# Patient Record
Sex: Female | Born: 1965 | Race: White | Hispanic: No | Marital: Single | Smoking: Never smoker
Health system: Southern US, Community
[De-identification: ages and names within clinical notes are randomized; demographics above are authoritative.]

---

## 2000-10-02 ENCOUNTER — Other Ambulatory Visit: Admission: RE | Admit: 2000-10-02 | Discharge: 2000-10-02 | Payer: Self-pay | Admitting: Internal Medicine

## 2002-02-03 ENCOUNTER — Ambulatory Visit (HOSPITAL_COMMUNITY): Admission: RE | Admit: 2002-02-03 | Discharge: 2002-02-03 | Payer: Self-pay | Admitting: Family Medicine

## 2002-02-03 ENCOUNTER — Encounter: Payer: Self-pay | Admitting: Family Medicine

## 2003-09-01 ENCOUNTER — Other Ambulatory Visit: Admission: RE | Admit: 2003-09-01 | Discharge: 2003-09-01 | Payer: Self-pay | Admitting: Obstetrics and Gynecology

## 2011-02-04 ENCOUNTER — Ambulatory Visit (HOSPITAL_COMMUNITY)
Admission: RE | Admit: 2011-02-04 | Discharge: 2011-02-04 | Disposition: A | Discharge status: Home / Self Care | Payer: Managed Care, Other (non HMO) | Source: Ambulatory Visit | Attending: Obstetrics and Gynecology | Admitting: Obstetrics and Gynecology

## 2011-02-04 DIAGNOSIS — R011 Cardiac murmur, unspecified: Secondary | ICD-10-CM | POA: Insufficient documentation

## 2011-02-04 DIAGNOSIS — I059 Rheumatic mitral valve disease, unspecified: Secondary | ICD-10-CM | POA: Insufficient documentation

## 2011-02-04 DIAGNOSIS — E119 Type 2 diabetes mellitus without complications: Secondary | ICD-10-CM | POA: Insufficient documentation

## 2011-02-04 DIAGNOSIS — F17200 Nicotine dependence, unspecified, uncomplicated: Secondary | ICD-10-CM | POA: Insufficient documentation

## 2011-02-04 DIAGNOSIS — I10 Essential (primary) hypertension: Secondary | ICD-10-CM | POA: Insufficient documentation

## 2015-01-22 ENCOUNTER — Telehealth: Payer: Self-pay

## 2015-01-22 NOTE — Telephone Encounter (Signed)
Dr. Asa Lente requested to contact pt and inform that MD is willing to take her on as a new pt with the understanding that she has limited availability

## 2015-04-11 ENCOUNTER — Ambulatory Visit (INDEPENDENT_AMBULATORY_CARE_PROVIDER_SITE_OTHER): Payer: 59 | Admitting: Family Medicine

## 2015-04-11 ENCOUNTER — Encounter: Payer: Self-pay | Admitting: Family Medicine

## 2015-04-11 VITALS — BP 110/64 | HR 82 | Ht 64.5 in | Wt 137.8 lb

## 2015-04-11 DIAGNOSIS — G479 Sleep disorder, unspecified: Secondary | ICD-10-CM

## 2015-04-11 DIAGNOSIS — F341 Dysthymic disorder: Secondary | ICD-10-CM

## 2015-04-11 DIAGNOSIS — F901 Attention-deficit hyperactivity disorder, predominantly hyperactive type: Secondary | ICD-10-CM | POA: Diagnosis not present

## 2015-04-11 DIAGNOSIS — Z818 Family history of other mental and behavioral disorders: Secondary | ICD-10-CM | POA: Diagnosis not present

## 2015-04-11 MED ORDER — AMPHETAMINE-DEXTROAMPHETAMINE 10 MG PO TABS: 10.0000 mg | ORAL_TABLET | Freq: Every day | ORAL | Status: DC

## 2015-04-11 NOTE — Patient Instructions (Addendum)
Let me know if the medicine works, how long doesn't work, and if you have any trouble with it. Use ear plugs  Insomnia Insomnia is frequent trouble falling and/or staying asleep. Insomnia can be a long term problem or a short term problem. Both are common. Insomnia can be a short term problem when the wakefulness is related to a certain stress or worry. Long term insomnia is often related to ongoing stress during waking hours and/or poor sleeping habits. Overtime, sleep deprivation itself can make the problem worse. Every little thing feels more severe because you are overtired and your ability to cope is decreased. CAUSES   Stress, anxiety, and depression.  Poor sleeping habits.  Distractions such as TV in the bedroom.  Naps close to bedtime.  Engaging in emotionally charged conversations before bed.  Technical reading before sleep.  Alcohol and other sedatives. They may make the problem worse. They can hurt normal sleep patterns and normal dream activity.  Stimulants such as caffeine for several hours prior to bedtime.  Pain syndromes and shortness of breath can cause insomnia.  Exercise late at night.  Changing time zones may cause sleeping problems (jet lag). It is sometimes helpful to have someone observe your sleeping patterns. They should look for periods of not breathing during the night (sleep apnea). They should also look to see how long those periods last. If you live alone or observers are uncertain, you can also be observed at a sleep clinic where your sleep patterns will be professionally monitored. Sleep apnea requires a checkup and treatment. Give your caregivers your medical history. Give your caregivers observations your family has made about your sleep.  SYMPTOMS   Not feeling rested in the morning.  Anxiety and restlessness at bedtime.  Difficulty falling and staying asleep. TREATMENT   Your caregiver may prescribe treatment for an underlying medical  disorders. Your caregiver can give advice or help if you are using alcohol or other drugs for self-medication. Treatment of underlying problems will usually eliminate insomnia problems.  Medications can be prescribed for short time use. They are generally not recommended for lengthy use.  Over-the-counter sleep medicines are not recommended for lengthy use. They can be habit forming.  You can promote easier sleeping by making lifestyle changes such as:  Using relaxation techniques that help with breathing and reduce muscle tension.  Exercising earlier in the day.  Changing your diet and the time of your last meal. No night time snacks.  Establish a regular time to go to bed.  Counseling can help with stressful problems and worry.  Soothing music and white noise may be helpful if there are background noises you cannot remove.  Stop tedious detailed work at least one hour before bedtime. HOME CARE INSTRUCTIONS   Keep a diary. Inform your caregiver about your progress. This includes any medication side effects. See your caregiver regularly. Take note of:  Times when you are asleep.  Times when you are awake during the night.  The quality of your sleep.  How you feel the next day. This information will help your caregiver care for you.  Get out of bed if you are still awake after 15 minutes. Read or do some quiet activity. Keep the lights down. Wait until you feel sleepy and go back to bed.  Keep regular sleeping and waking hours. Avoid naps.  Exercise regularly.  Avoid distractions at bedtime. Distractions include watching television or engaging in any intense or detailed activity like attempting to balance  the household checkbook.  Develop a bedtime ritual. Keep a familiar routine of bathing, brushing your teeth, climbing into bed at the same time each night, listening to soothing music. Routines increase the success of falling to sleep faster.  Use relaxation techniques.  This can be using breathing and muscle tension release routines. It can also include visualizing peaceful scenes. You can also help control troubling or intruding thoughts by keeping your mind occupied with boring or repetitive thoughts like the old concept of counting sheep. You can make it more creative like imagining planting one beautiful flower after another in your backyard garden.  During your day, work to eliminate stress. When this is not possible use some of the previous suggestions to help reduce the anxiety that accompanies stressful situations. MAKE SURE YOU:   Understand these instructions.  Will watch your condition.  Will get help right away if you are not doing well or get worse. Document Released: 11/28/2000 Document Revised: 02/23/2012 Document Reviewed: 12/29/2007 [facility] Patient Information 2015 [place], [place]. This information is not intended to replace advice given to you by your health care provider. Make sure you discuss any questions you have with your health care provider.

## 2015-04-11 NOTE — Progress Notes (Signed)
   Subjective:    Patient ID: Maureen White, female    DOB: Feb 02, 1966, 49 y.o.   MRN: [id number]  HPI She is here to establish care. Her previously her care was through her gynecologist. He has been treating her for hypothyroid with Synthroid however she had difficulty with that medication and is now on an over-the-counter thyroid medication. She states that her last TSH was around 4 and prior to this it was around 5. Also she is taking Wellbutrin 300 mg per she has been on this for several years. There is a family history of depression. She states that the medicine helps stabilize her mood. She does occasionally use lorazepam usually for sleep. Her partner apparently cannot sleep without some form of white noise which usually takes the form of leaving the TV on. She and her partner have been involved in counseling. She is also been involved in individual counseling. She also has questions concerning memory as well as easy distractibility. Further discussion with her indicates she has had difficulty with focus, and tension and inability to get her tasks done. She states that this has occurred her entire life.   Review of Systems     Objective:   Physical Exam Alert and in no distress. ADHD score was 46.       Assessment & Plan:  ADHD (attention deficit hyperactivity disorder), predominantly hyperactive impulsive type - Plan: amphetamine-dextroamphetamine (ADDERALL) 10 MG tablet  Sleep disturbance  Family history of depression  Dysthymia At this time I will continue her on her present medication of Wellbutrin with occasional use of the lorazepam. Did mention using earplugs to help with sleep at night. Also gave sleep hygiene information. Encouraged her to continue in counseling. She certainly fulfills criteria of ADHD with her questionnaire being very positive and history of this occurring her entire life.I will start her out on Adderall. Discussed letting me know if it works, how long it  works and if she has any side effects. She will call me next week.

## 2015-04-18 ENCOUNTER — Telehealth: Payer: Self-pay | Admitting: Family Medicine

## 2015-04-18 NOTE — Telephone Encounter (Signed)
She actually told me that she had taken 20 without any benefit. I will have her go to 30 mg.

## 2015-04-18 NOTE — Telephone Encounter (Signed)
She called stating that the Adderall was not effective. I will have her go to 15 mg

## 2015-04-18 NOTE — Telephone Encounter (Signed)
Pt called and stated that she did not think the strength of adderall is not strong enough. She would like either a stronger dose or extended release. Pt can be reached at [phone].

## 2015-04-19 ENCOUNTER — Telehealth: Payer: Self-pay | Admitting: Family Medicine

## 2015-04-19 NOTE — Telephone Encounter (Signed)
Initiated P.A. Methylphenidate

## 2015-04-21 NOTE — Telephone Encounter (Signed)
P.A. Approved til 04/18/16, faxed pharmacy

## 2015-04-26 ENCOUNTER — Telehealth: Payer: Self-pay | Admitting: Family Medicine

## 2015-04-26 MED ORDER — METHYLPHENIDATE HCL 20 MG PO TABS: 20.0000 mg | ORAL_TABLET | Freq: Two times a day (BID) | ORAL | Status: DC

## 2015-04-26 NOTE — Telephone Encounter (Signed)
She states that 30 mg of the Adderall really had very little effect. I will therefore switch her to Ritalin starting at 20 mg. I wrote for twice a day dosing but recommended she try just 1 pill at time to see if it works, how long it works and if there are any difficulties

## 2015-04-26 NOTE — Telephone Encounter (Signed)
Pt called and stated she needed a refill for her ADD medications. She also mentioned that she would like to speak to you concerning the dose and/or medication change. She was offered an appt and she stated she was unable to come in right now. Please call pt at [phone].

## 2015-05-07 ENCOUNTER — Encounter: Payer: Self-pay | Admitting: Family Medicine

## 2015-05-29 ENCOUNTER — Ambulatory Visit (INDEPENDENT_AMBULATORY_CARE_PROVIDER_SITE_OTHER): Payer: 59 | Admitting: Family Medicine

## 2015-05-29 ENCOUNTER — Other Ambulatory Visit: Payer: Self-pay

## 2015-05-29 ENCOUNTER — Encounter: Payer: Self-pay | Admitting: Family Medicine

## 2015-05-29 VITALS — BP 112/70 | HR 60 | Wt 134.0 lb

## 2015-05-29 DIAGNOSIS — F341 Dysthymic disorder: Secondary | ICD-10-CM

## 2015-05-29 DIAGNOSIS — G479 Sleep disorder, unspecified: Secondary | ICD-10-CM | POA: Diagnosis not present

## 2015-05-29 DIAGNOSIS — Z630 Problems in relationship with spouse or partner: Secondary | ICD-10-CM | POA: Diagnosis not present

## 2015-05-29 DIAGNOSIS — M7918 Myalgia, other site: Secondary | ICD-10-CM

## 2015-05-29 DIAGNOSIS — F909 Attention-deficit hyperactivity disorder, unspecified type: Secondary | ICD-10-CM | POA: Insufficient documentation

## 2015-05-29 DIAGNOSIS — F901 Attention-deficit hyperactivity disorder, predominantly hyperactive type: Secondary | ICD-10-CM

## 2015-05-29 DIAGNOSIS — E039 Hypothyroidism, unspecified: Secondary | ICD-10-CM

## 2015-05-29 DIAGNOSIS — M791 Myalgia: Secondary | ICD-10-CM

## 2015-05-29 LAB — POCT URINALYSIS DIPSTICK
Bilirubin, UA: NEGATIVE
Blood, UA: NEGATIVE
Glucose, UA: NEGATIVE
KETONES UA: NEGATIVE
Leukocytes, UA: NEGATIVE
Nitrite, UA: NEGATIVE
PH UA: 6
PROTEIN UA: NEGATIVE
SPEC GRAV UA: 1.02
Urobilinogen, UA: NEGATIVE

## 2015-05-29 MED ORDER — ALPRAZOLAM 0.5 MG PO TABS: 0.5000 mg | ORAL_TABLET | Freq: Every evening | ORAL | Status: DC | PRN

## 2015-05-29 MED ORDER — BUPROPION HCL ER (XL) 300 MG PO TB24: 300.0000 mg | ORAL_TABLET | Freq: Every day | ORAL | Status: DC

## 2015-05-29 MED ORDER — LEVOTHYROXINE SODIUM 100 MCG PO TABS: 100.0000 ug | ORAL_TABLET | Freq: Every day | ORAL | Status: DC

## 2015-05-29 NOTE — Progress Notes (Signed)
   Subjective:    Patient ID: Maureen White, female    DOB: 30-Nov-1966, 49 y.o.   MRN: [id number]  HPI Approximately 4 days ago she noted the onset of left sided flank and back pain. No history of preceding illness injury to that area. Pain is gotten much worse and is made worse with motion. She has seen chiropractor in the past for other issues. She did try Flexeril which made her quite drowsy. She is now 5 weeks out of a long-term relationship and still having difficulty dealing with this. She has difficulty with anxiety and she continues in counseling with Rhona Raider working on personal issues. She needs a refill on her Wellbutrin. She has been on that for quite some time. She would also like some Xanax to help with sleep and anxiety. She does have an underlying history of ADD. She has been on Ritalin 20 mg and noted no real change in her ability to focus. She does state that that seems to be the main problem right now specialist about the stress that she is under. She also recently saw her gynecologist and apparently thyroid function was not normal. She has concerns over the thyroid medication that she is getting.   Review of Systems     Objective:   Physical Exam Alert and in no distress. Pain on motion with some tenderness palpation in the lower thoracic, upper lumbar area on the left. Lungs are clear to auscultation. Slight tenderness over the upper lumbar and lower thoracic spine area.       Assessment & Plan:  ADHD (attention deficit hyperactivity disorder), predominantly hyperactive impulsive type  Sleep disturbance  Relationship problem between partners - Plan: ALPRAZolam (XANAX) 0.5 MG tablet  Dysthymia - Plan: buPROPion (WELLBUTRIN XL) 300 MG 24 hr tablet  Hypothyroidism, unspecified hypothyroidism type - Plan: levothyroxine (SYNTHROID, LEVOTHROID) 100 MCG tablet  Musculoskeletal pain - Plan: POCT Urinalysis Dipstick She will follow-up with her chiropractor to see if this  will help with her musculoskeletal pain. I will renew her Wellbutrin. Xanax we called in to help with anxiety and sleep. Sample of Synthroid 100 g given. She will let me know how this makes her feel. I will also have her increase her Ritalin to 30 mg twice per day and see if this will help with focus. Discussed the possibility of switching her to [facility] but will hold off on that since she is already taking Wellbutrin.

## 2015-06-21 ENCOUNTER — Telehealth: Payer: Self-pay | Admitting: Family Medicine

## 2015-06-21 ENCOUNTER — Other Ambulatory Visit: Payer: Self-pay

## 2015-06-21 NOTE — Telephone Encounter (Signed)
Okay to renew

## 2015-06-21 NOTE — Telephone Encounter (Signed)
Recv'd fax refill request for Alprazolam 0.5mg  #30 to [facility]

## 2015-06-21 NOTE — Telephone Encounter (Signed)
Called in.

## 2015-06-28 ENCOUNTER — Telehealth: Payer: Self-pay | Admitting: Family Medicine

## 2015-06-28 DIAGNOSIS — F901 Attention-deficit hyperactivity disorder, predominantly hyperactive type: Secondary | ICD-10-CM

## 2015-06-28 NOTE — Telephone Encounter (Signed)
Needs Rx  For ADD meds  She wants to go back to Adderral

## 2015-07-02 ENCOUNTER — Telehealth: Payer: Self-pay | Admitting: Family Medicine

## 2015-07-02 MED ORDER — AMPHETAMINE-DEXTROAMPHETAMINE 15 MG PO TABS: 15.0000 mg | ORAL_TABLET | Freq: Every day | ORAL | Status: DC

## 2015-07-02 NOTE — Telephone Encounter (Signed)
Pt informed Rx x1 ready for pick up

## 2015-07-02 NOTE — Telephone Encounter (Signed)
Pt called again to request refill on Adderall. She said thinks the mg's were 30mg 

## 2015-07-02 NOTE — Telephone Encounter (Signed)
She would like to switch back to Adderall. She was on 20 of Ritalin with some benefit but not enough. I will order back on 15 of Adderall and she will let me know how she does on this.

## 2015-07-04 ENCOUNTER — Telehealth: Payer: Self-pay | Admitting: Family Medicine

## 2015-07-04 NOTE — Telephone Encounter (Signed)
Initiated P.A. Amphetamine Salts

## 2015-07-05 NOTE — Telephone Encounter (Signed)
P.A. Approved til 07/03/16, pt informed, faxed pharmacy

## 2015-07-25 ENCOUNTER — Other Ambulatory Visit: Payer: Self-pay

## 2015-07-25 ENCOUNTER — Other Ambulatory Visit: Payer: Self-pay | Admitting: Family Medicine

## 2015-07-25 NOTE — Telephone Encounter (Signed)
IS THIS OKAY 

## 2015-07-25 NOTE — Telephone Encounter (Signed)
Okay to renew

## 2015-08-01 ENCOUNTER — Other Ambulatory Visit: Payer: Self-pay | Admitting: Family Medicine

## 2015-08-01 DIAGNOSIS — B001 Herpesviral vesicular dermatitis: Secondary | ICD-10-CM

## 2015-08-01 MED ORDER — VALACYCLOVIR HCL 1 G PO TABS: ORAL_TABLET | ORAL | Status: AC

## 2015-08-01 MED ORDER — AMPHETAMINE-DEXTROAMPHETAMINE 20 MG PO TABS: 20.0000 mg | ORAL_TABLET | Freq: Every day | ORAL | Status: DC

## 2015-08-01 NOTE — Progress Notes (Signed)
She has a hx of Herpes Labialis and needs Valtrex. Also needs a higher dose of Adderall

## 2015-08-28 ENCOUNTER — Other Ambulatory Visit: Payer: Self-pay | Admitting: Family Medicine

## 2015-08-28 ENCOUNTER — Other Ambulatory Visit: Payer: Self-pay

## 2015-08-28 ENCOUNTER — Ambulatory Visit: Payer: 59 | Admitting: Family Medicine

## 2015-08-28 MED ORDER — SULFAMETHOXAZOLE-TRIMETHOPRIM 800-160 MG PO TABS: 1.0000 | ORAL_TABLET | Freq: Two times a day (BID) | ORAL | Status: DC

## 2015-08-28 NOTE — Telephone Encounter (Signed)
Okay to renew

## 2015-08-28 NOTE — Telephone Encounter (Signed)
Is this okay?

## 2015-10-18 ENCOUNTER — Other Ambulatory Visit: Payer: Self-pay | Admitting: Family Medicine

## 2015-10-18 NOTE — Telephone Encounter (Signed)
Is this ok to refill?  

## 2015-10-18 NOTE — Telephone Encounter (Signed)
Okay to renew

## 2015-10-19 NOTE — Telephone Encounter (Signed)
Called in med to pharmacy  

## 2015-11-22 ENCOUNTER — Other Ambulatory Visit: Payer: Self-pay | Admitting: Family Medicine

## 2015-11-22 NOTE — Telephone Encounter (Signed)
OK to fill

## 2015-11-22 NOTE — Telephone Encounter (Signed)
Called script to [facility].

## 2015-11-22 NOTE — Telephone Encounter (Signed)
Okay to renew

## 2015-12-27 ENCOUNTER — Other Ambulatory Visit: Payer: Self-pay | Admitting: Family Medicine

## 2015-12-27 NOTE — Telephone Encounter (Signed)
Called in med to pharmacy  

## 2015-12-27 NOTE — Telephone Encounter (Signed)
Ok to refill 

## 2015-12-27 NOTE — Telephone Encounter (Signed)
Okay to renew

## 2016-02-12 ENCOUNTER — Other Ambulatory Visit: Payer: Self-pay | Admitting: *Deleted

## 2016-02-12 ENCOUNTER — Other Ambulatory Visit: Payer: Self-pay | Admitting: Family Medicine

## 2016-02-12 NOTE — Telephone Encounter (Signed)
Dr Redmond School ok to refill

## 2016-02-12 NOTE — Telephone Encounter (Signed)
Have her set up an appointment 

## 2016-02-12 NOTE — Telephone Encounter (Signed)
E-scribed by Dr Redmond School 1 refill needs an appt

## 2016-02-12 NOTE — Telephone Encounter (Signed)
Left VM for call back Have her set up an appointment ( Wellbutrin refilled with0 refills)

## 2016-02-20 ENCOUNTER — Telehealth: Payer: Self-pay | Admitting: Family Medicine

## 2016-02-20 MED ORDER — AMPHETAMINE-DEXTROAMPHETAMINE 20 MG PO TABS: 20.0000 mg | ORAL_TABLET | Freq: Every day | ORAL | Status: DC

## 2016-02-20 NOTE — Telephone Encounter (Signed)
Called pt to advise that [facility] 20mg  #30 script is ready to be picked up.

## 2016-02-20 NOTE — Telephone Encounter (Signed)
PT called for refill of Adderall.  Pt ph [phone]

## 2016-04-02 ENCOUNTER — Telehealth: Payer: Self-pay | Admitting: *Deleted

## 2016-04-02 NOTE — Telephone Encounter (Signed)
Patient called and is requesting refill on adderall and lorazepam. Said she also spoke to you about increasing the dosage on the adderall. She was advised that you were out of the office today and would address tomorrow.

## 2016-04-02 NOTE — Telephone Encounter (Signed)
Have her come in for a visit so we can discuss this

## 2016-04-03 NOTE — Telephone Encounter (Signed)
Left message for pt to call me back 

## 2016-04-04 NOTE — Telephone Encounter (Signed)
Pt has appointment 

## 2016-04-09 ENCOUNTER — Encounter: Payer: Self-pay | Admitting: Family Medicine

## 2016-04-18 ENCOUNTER — Encounter: Payer: Self-pay | Admitting: Family Medicine

## 2016-04-18 ENCOUNTER — Ambulatory Visit (INDEPENDENT_AMBULATORY_CARE_PROVIDER_SITE_OTHER): Payer: Commercial Managed Care - HMO | Admitting: Family Medicine

## 2016-04-18 DIAGNOSIS — F901 Attention-deficit hyperactivity disorder, predominantly hyperactive type: Secondary | ICD-10-CM

## 2016-04-18 DIAGNOSIS — F341 Dysthymic disorder: Secondary | ICD-10-CM | POA: Diagnosis not present

## 2016-04-18 MED ORDER — BUPROPION HCL ER (XL) 300 MG PO TB24: ORAL_TABLET | ORAL | Status: DC

## 2016-04-18 MED ORDER — AMPHETAMINE-DEXTROAMPHET ER 25 MG PO CP24: 25.0000 mg | ORAL_CAPSULE | ORAL | Status: DC

## 2016-04-18 MED ORDER — CITALOPRAM HYDROBROMIDE 20 MG PO TABS: 20.0000 mg | ORAL_TABLET | Freq: Every day | ORAL | Status: DC

## 2016-04-18 NOTE — Progress Notes (Signed)
   Subjective:    Patient ID: Maureen White, female    DOB: 01-Apr-1966, 50 y.o.   MRN: [id number]  HPI She is here for a follow-up concerning her underlying ADD as well as dysthymia. She has been on Adderall 20 mg and getting roughly 6-7 hours of benefit out of it however she is concerned that she might need a higher dose for better focus. She is also noted some slight irritability when the medication wears off. She also has been on Wellbutrin for several years. She has underlying history of dysthymia and found that the Wellbutrin helped remove a cloud from her. Psychologically her personally seem to be in better shape and she feels as if she would like to see if she can feel even better. She has been getting lorazepam from her gynecologist and rarely uses that.   Review of Systems     Objective:   Physical Exam Alert and in no distress otherwise not examined. She is dressed appropriately with appropriate affect       Assessment & Plan:  ADHD (attention deficit hyperactivity disorder), predominantly hyperactive impulsive type - Plan: amphetamine-dextroamphetamine (ADDERALL XR) 25 MG 24 hr capsule  Dysthymia - Plan: citalopram (CELEXA) 20 MG tablet, buPROPion (WELLBUTRIN XL) 300 MG 24 hr tablet I will try her on Adderall XR to see if she can have less withdrawal symptoms and get better focus. She will let me how this works. Discussed the Wellbutrin with her and have decided to add Celexa to the regimen to see if this will help overall with her mental health. She will keep me informed concerning this. Over 25 minutes, greater than 50% spent in counseling and coordination of care.

## 2016-05-29 ENCOUNTER — Telehealth: Payer: Self-pay | Admitting: Internal Medicine

## 2016-05-29 DIAGNOSIS — F901 Attention-deficit hyperactivity disorder, predominantly hyperactive type: Secondary | ICD-10-CM

## 2016-05-29 MED ORDER — AMPHETAMINE-DEXTROAMPHET ER 25 MG PO CP24: 25.0000 mg | ORAL_CAPSULE | ORAL | Status: DC

## 2016-05-29 NOTE — Telephone Encounter (Signed)
Pt called needing a refill on her adderall. Pt was advised it would be tomorrow 05/30/16 before you returned.

## 2016-06-10 ENCOUNTER — Telehealth: Payer: Self-pay | Admitting: Family Medicine

## 2016-06-10 ENCOUNTER — Other Ambulatory Visit: Payer: Self-pay

## 2016-06-10 MED ORDER — LORAZEPAM 1 MG PO TABS: 1.0000 mg | ORAL_TABLET | Freq: Two times a day (BID) | ORAL | Status: DC | PRN

## 2016-06-10 MED ORDER — PAROXETINE HCL 10 MG PO TABS: 10.0000 mg | ORAL_TABLET | Freq: Every day | ORAL | Status: DC

## 2016-06-10 NOTE — Telephone Encounter (Signed)
Called in xanax per JCL 

## 2016-06-10 NOTE — Telephone Encounter (Signed)
Pt come in and wants a refill on her lorazepam  States that she was getting at her gynecologist  dr but would prefer to get all of her medicine from you. Pt uses [facility], [place]

## 2016-06-10 NOTE — Telephone Encounter (Signed)
Call in the lorazepam

## 2016-06-10 NOTE — Telephone Encounter (Signed)
He has been getting lorazepam from her gynecologist and would like to get it from me. She is using this once or twice per day to help with anxiety. She stopped taking the Celexa due to the fact that she thought it made her sick. I will give her lorazepam, switch her to Paxil and have her follow-up with me in one month.

## 2016-08-26 ENCOUNTER — Other Ambulatory Visit: Payer: Self-pay | Admitting: Family Medicine

## 2016-08-26 NOTE — Telephone Encounter (Signed)
Is this okay to refill? 

## 2016-08-28 ENCOUNTER — Other Ambulatory Visit: Payer: Self-pay | Admitting: Family Medicine

## 2016-08-28 NOTE — Telephone Encounter (Signed)
Is this okay to refill? 

## 2016-12-30 DIAGNOSIS — Z23 Encounter for immunization: Secondary | ICD-10-CM | POA: Diagnosis not present

## 2017-01-22 ENCOUNTER — Telehealth: Payer: Self-pay | Admitting: Family Medicine

## 2017-01-22 DIAGNOSIS — F901 Attention-deficit hyperactivity disorder, predominantly hyperactive type: Secondary | ICD-10-CM

## 2017-01-22 MED ORDER — AMPHETAMINE-DEXTROAMPHET ER 25 MG PO CP24: 25.0000 mg | ORAL_CAPSULE | ORAL | 0 refills | Status: DC

## 2017-01-22 NOTE — Telephone Encounter (Signed)
Pt called for refills of Adderall XR. Please call pt at [phone] when ready.

## 2017-01-22 NOTE — Telephone Encounter (Signed)
Left message rx is ready

## 2017-02-05 DIAGNOSIS — D492 Neoplasm of unspecified behavior of bone, soft tissue, and skin: Secondary | ICD-10-CM | POA: Diagnosis not present

## 2017-02-11 DIAGNOSIS — Z1231 Encounter for screening mammogram for malignant neoplasm of breast: Secondary | ICD-10-CM | POA: Diagnosis not present

## 2017-02-11 DIAGNOSIS — N912 Amenorrhea, unspecified: Secondary | ICD-10-CM | POA: Diagnosis not present

## 2017-02-11 DIAGNOSIS — Z6821 Body mass index (BMI) 21.0-21.9, adult: Secondary | ICD-10-CM | POA: Diagnosis not present

## 2017-02-11 DIAGNOSIS — Z01419 Encounter for gynecological examination (general) (routine) without abnormal findings: Secondary | ICD-10-CM | POA: Diagnosis not present

## 2017-06-23 ENCOUNTER — Telehealth: Payer: Self-pay | Admitting: Family Medicine

## 2017-06-23 NOTE — Telephone Encounter (Signed)
Called and got no answer.

## 2017-06-23 NOTE — Telephone Encounter (Signed)
Pt needs refill Adderall, please call when ready

## 2017-06-23 NOTE — Telephone Encounter (Signed)
She needs an office visit 

## 2017-06-30 ENCOUNTER — Ambulatory Visit (INDEPENDENT_AMBULATORY_CARE_PROVIDER_SITE_OTHER): Payer: 59 | Admitting: Family Medicine

## 2017-06-30 ENCOUNTER — Encounter: Payer: Self-pay | Admitting: Family Medicine

## 2017-06-30 VITALS — BP 106/68 | HR 89 | Wt 127.6 lb

## 2017-06-30 DIAGNOSIS — F901 Attention-deficit hyperactivity disorder, predominantly hyperactive type: Secondary | ICD-10-CM

## 2017-06-30 DIAGNOSIS — F341 Dysthymic disorder: Secondary | ICD-10-CM

## 2017-06-30 DIAGNOSIS — G479 Sleep disorder, unspecified: Secondary | ICD-10-CM | POA: Insufficient documentation

## 2017-06-30 MED ORDER — LISDEXAMFETAMINE DIMESYLATE 50 MG PO CAPS
50.0000 mg | ORAL_CAPSULE | Freq: Every day | ORAL | 0 refills | Status: DC
Start: 2017-06-30 — End: 2017-08-04

## 2017-06-30 NOTE — Progress Notes (Signed)
   Subjective:    Patient ID: Maureen White, female    DOB: 10-29-1966, 51 y.o.   MRN: [id number]  HPI She is here for a consult. She does have underlying ADD and has been using Adderall XR. She is not sure whether it is giving her good relief. She states it lasts roughly 5 hours. She is interested in a longer acting preparation. She also notes difficulty with sleep disturbance and has been using lorazepam to help with that. She is in the process of trying to wean herself off that. She is also taking Wellbutrin and has been on that for several years. She feels the need to continue on that in that she is dealing with the end of a long-term relationship. 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 She realizes codependency issues.   Review of Systems     Objective:   Physical Exam Alert and in no distress with appropriate affect       Assessment & Plan:  ADHD (attention deficit hyperactivity disorder), predominantly hyperactive impulsive type - Plan: lisdexamfetamine (VYVANSE) 50 MG capsule  Dysthymia  Sleep disturbance Sleep hygiene information given to help with getting off the lorazepam. She will continue on the Wellbutrin for the time being as this is helping psychologically. We may readdress this next year. The lorazepam and Wellbutrin were given to her by Dr. Radene Knee her gynecologist. I will try her on Vyvanse hoping she can get it at a reasonable price. Otherwise I will switch her back to Adderall and will work on the appropriate dosing regimen. May possibly need to go to twice a day dosing of the Adderall XR

## 2017-06-30 NOTE — Patient Instructions (Signed)

## 2017-08-04 ENCOUNTER — Telehealth: Payer: Self-pay | Admitting: Family Medicine

## 2017-08-04 MED ORDER — LISDEXAMFETAMINE DIMESYLATE 60 MG PO CAPS: 60.0000 mg | ORAL_CAPSULE | ORAL | 0 refills | Status: DC

## 2017-08-04 NOTE — Telephone Encounter (Signed)
Pt called and is requesting a refill on her vyvanse she is also wanting to know if she could get a higher dose she does not think the 50 is doing enough, she can be reached at [phone] when ready to be picked up

## 2017-08-04 NOTE — Telephone Encounter (Signed)
Pt informed Rx Vyvanse ready for pick up

## 2017-08-04 NOTE — Telephone Encounter (Signed)
She would like a higher dose of Vyvanse. She is not sure whether the lower doses really working as well as she would like

## 2017-09-23 ENCOUNTER — Telehealth: Payer: Self-pay | Admitting: Family Medicine

## 2017-09-23 NOTE — Telephone Encounter (Signed)
Pt needs refill Vyvanse 60mg , pt is out, please call when ready

## 2017-09-23 NOTE — Telephone Encounter (Signed)
It looks like dose was increased by Dr. Redmond School on last refill, based on a phone call.  I'm not sure if he would like feedback on that dose before refilling it for her.  She has no follow-up scheduled.  I'm forwarding to Dr. Redmond School.

## 2017-09-28 MED ORDER — LISDEXAMFETAMINE DIMESYLATE 70 MG PO CAPS: 70.0000 mg | ORAL_CAPSULE | Freq: Every day | ORAL | 0 refills | Status: DC

## 2017-09-28 NOTE — Telephone Encounter (Signed)
Call and verify that the Vyvanse 60 mg is working and I will write for that

## 2017-09-28 NOTE — Telephone Encounter (Signed)
Pt states she has not noticed a big difference since the increase. She inquires if medication can be increased again. Victorino December

## 2017-09-28 NOTE — Telephone Encounter (Signed)
Pt called to check on status.

## 2017-09-28 NOTE — Telephone Encounter (Signed)
Apparently the Vyvanse 60 has not worked. I will increase her to 70 and have her call back.

## 2017-09-29 ENCOUNTER — Telehealth: Payer: Self-pay | Admitting: Family Medicine

## 2017-09-29 NOTE — Telephone Encounter (Signed)
Left message informing pt rx is ready.

## 2017-09-30 ENCOUNTER — Ambulatory Visit (INDEPENDENT_AMBULATORY_CARE_PROVIDER_SITE_OTHER): Payer: 59 | Admitting: Family Medicine

## 2017-09-30 ENCOUNTER — Encounter: Payer: Self-pay | Admitting: Family Medicine

## 2017-09-30 VITALS — BP 122/72 | HR 70 | Temp 98.4°F | Wt 123.8 lb

## 2017-09-30 DIAGNOSIS — L509 Urticaria, unspecified: Secondary | ICD-10-CM

## 2017-09-30 DIAGNOSIS — Z23 Encounter for immunization: Secondary | ICD-10-CM

## 2017-09-30 DIAGNOSIS — F901 Attention-deficit hyperactivity disorder, predominantly hyperactive type: Secondary | ICD-10-CM

## 2017-09-30 DIAGNOSIS — H00011 Hordeolum externum right upper eyelid: Secondary | ICD-10-CM

## 2017-09-30 NOTE — Progress Notes (Signed)
   Subjective:    Patient ID: Maureen White, female    DOB: 1966-04-16, 51 y.o.   MRN: [id number]  HPI She is here for evaluation of a lesion on her right upper eyelid that has been there for about a week. She also recently started the higher dose of Vyvanse and thinks that it is helping but has only had one dose. She also is slightly went to the funeral of a relative who apparently died of an overdose. She also admits to very strong family history of drug abuse and suicide. This did bring back some unresolved issues since she developed hives after that which apparently this occurs when she gets stressed. She did note that Xanax did help quiet this down.   Review of Systems     Objective:   Physical Exam Alert and in no distress. 0.5 cm swollen slightly tender lesion noted on the midportion of the right upper eyelid. Cornea and conjunctiva normal.       Assessment & Plan:  Need for prophylactic vaccination and inoculation against influenza - Plan: Flu Vaccine QUAD 6+ mos PF IM (Fluarix Quad PF)  Hordeolum externum of right upper eyelid  ADHD (attention deficit hyperactivity disorder), predominantly hyperactive impulsive type  Hives Recommended warm soaks for the stye 3 times per day and if continued difficulty and we call for referral to ophthalmologist. She is to call me in the next week to let me know how the higher dose of the Vyvanse is working. We then discussed the stress that she is under from the recent death. She is getting counseling and is also starting to go to Al-Anon. She does realize that her family history is very significant to the way she is handling her present problem. Among other things she realizes that she is a adult child of an alcoholic and has always been a pleaser. She is now trying to work on the relationship she is having with herself. At this point we will not treat the hives that she is having less difficulty with that.

## 2017-11-13 ENCOUNTER — Telehealth: Payer: Self-pay | Admitting: Family Medicine

## 2017-11-13 DIAGNOSIS — Z0100 Encounter for examination of eyes and vision without abnormal findings: Secondary | ICD-10-CM | POA: Diagnosis not present

## 2017-11-13 NOTE — Telephone Encounter (Signed)
Pt called requesting  A refill on her vyvanse pt can be reached at [phone] informed pt that you was out of the office today,

## 2017-11-15 MED ORDER — LISDEXAMFETAMINE DIMESYLATE 70 MG PO CAPS: 70.0000 mg | ORAL_CAPSULE | Freq: Every day | ORAL | 0 refills | Status: DC

## 2017-11-16 MED ORDER — LISDEXAMFETAMINE DIMESYLATE 70 MG PO CAPS: 70.0000 mg | ORAL_CAPSULE | Freq: Every day | ORAL | 0 refills | Status: DC

## 2017-11-16 NOTE — Telephone Encounter (Signed)
Spoke with Abigail Butts at [facility]- They rcv'd the script on [place] for 11/15/17 and 12/16/17, and they were properly written. Please re-send script for 01/16/2018. They will place it on hold. Victorino December

## 2017-11-16 NOTE — Addendum Note (Signed)
Addended by: Jill Alexanders C on: 11/16/2017 11:00 AM   Modules accepted: Orders

## 2018-01-07 ENCOUNTER — Telehealth: Payer: Self-pay | Admitting: Family Medicine

## 2018-01-07 ENCOUNTER — Other Ambulatory Visit: Payer: Self-pay | Admitting: Medical

## 2018-01-07 MED ORDER — LISDEXAMFETAMINE DIMESYLATE 70 MG PO CAPS: 70.0000 mg | ORAL_CAPSULE | Freq: Every day | ORAL | 0 refills | Status: DC

## 2018-01-07 NOTE — Telephone Encounter (Signed)
PT informed rx is ready

## 2018-01-07 NOTE — Telephone Encounter (Signed)
rx ready, 13mo supply

## 2018-01-07 NOTE — Telephone Encounter (Signed)
Pt called for refills of Vyvanse. Please send to [facility]. Pt can be reached at [phone]. Sending to [facility] as Kendrick Fries is not in office.

## 2018-01-12 DIAGNOSIS — R1084 Generalized abdominal pain: Secondary | ICD-10-CM | POA: Diagnosis not present

## 2018-01-14 ENCOUNTER — Telehealth: Payer: Self-pay | Admitting: Family Medicine

## 2018-01-14 NOTE — Telephone Encounter (Signed)
The record indicates that she should have a prescription dated 2/2 that she can pick up and should not need written prescription.

## 2018-01-14 NOTE — Telephone Encounter (Signed)
Please see initial message.

## 2018-01-14 NOTE — Telephone Encounter (Signed)
Pt called and stated that she called for refills of Vyvanse and a rx was hand written. RX is up front. She is requesting that this be sent electronically instead as she is having a hard time getting here to pick up. Pt uses [facility] and can be reached at [phone]. Sending back paper rx to be reviewed.

## 2018-01-19 DIAGNOSIS — R102 Pelvic and perineal pain: Secondary | ICD-10-CM | POA: Diagnosis not present

## 2018-01-19 DIAGNOSIS — R1031 Right lower quadrant pain: Secondary | ICD-10-CM | POA: Diagnosis not present

## 2018-04-18 ENCOUNTER — Other Ambulatory Visit: Payer: Self-pay | Admitting: Family Medicine

## 2018-04-19 ENCOUNTER — Telehealth: Payer: Self-pay | Admitting: Medical

## 2018-04-19 MED ORDER — LISDEXAMFETAMINE DIMESYLATE 70 MG PO CAPS: 70.0000 mg | ORAL_CAPSULE | Freq: Every day | ORAL | 0 refills | Status: DC

## 2018-04-19 NOTE — Telephone Encounter (Signed)
[facility] wants to fill pt vyvanse. Please advise. [place]

## 2018-04-19 NOTE — Telephone Encounter (Signed)
[facility] called to advise Vyvanse requiring P.A.

## 2018-04-27 NOTE — Telephone Encounter (Signed)
P.A. VYVANSE  °

## 2018-05-02 NOTE — Telephone Encounter (Signed)
P.A. Approved til 04/28/19, faxed pharmacy, tried to reach pt no answer

## 2018-06-04 ENCOUNTER — Telehealth: Payer: Self-pay

## 2018-06-04 MED ORDER — LISDEXAMFETAMINE DIMESYLATE 70 MG PO CAPS: 70.0000 mg | ORAL_CAPSULE | Freq: Every day | ORAL | 0 refills | Status: DC

## 2018-06-04 NOTE — Telephone Encounter (Signed)
Called patient to get her in for CPE, she has appointment on 08-20-18 to come in.   She also asked if you could refill her vyvance until her appointment.   She is now using [facility], [place],  Please advise.  Thank you

## 2018-06-04 NOTE — Addendum Note (Signed)
Addended by: Denita Lung on: 06/04/2018 12:39 PM   Modules accepted: Orders

## 2018-06-23 DIAGNOSIS — M79671 Pain in right foot: Secondary | ICD-10-CM | POA: Diagnosis not present

## 2018-06-23 DIAGNOSIS — M19071 Primary osteoarthritis, right ankle and foot: Secondary | ICD-10-CM | POA: Diagnosis not present

## 2018-08-04 ENCOUNTER — Other Ambulatory Visit: Payer: Self-pay

## 2018-08-04 MED ORDER — LISDEXAMFETAMINE DIMESYLATE 70 MG PO CAPS: 70.0000 mg | ORAL_CAPSULE | Freq: Every day | ORAL | 0 refills | Status: DC

## 2018-08-04 NOTE — Telephone Encounter (Signed)
Pt called and is requesting a refill on Vyvanse. Stated she will be in town next week and can be seen then. Stated she will call back and schedule an appt for then. Please advise.

## 2018-08-20 ENCOUNTER — Encounter: Payer: Self-pay | Admitting: Family Medicine

## 2018-09-17 ENCOUNTER — Encounter: Payer: Self-pay | Admitting: Family Medicine

## 2018-11-01 ENCOUNTER — Encounter: Payer: Self-pay | Admitting: Family Medicine

## 2018-11-01 ENCOUNTER — Ambulatory Visit: Payer: 59 | Admitting: Family Medicine

## 2018-11-01 VITALS — BP 120/78 | HR 68 | Temp 98.0°F | Wt 126.4 lb

## 2018-11-01 DIAGNOSIS — Z6821 Body mass index (BMI) 21.0-21.9, adult: Secondary | ICD-10-CM | POA: Diagnosis not present

## 2018-11-01 DIAGNOSIS — F901 Attention-deficit hyperactivity disorder, predominantly hyperactive type: Secondary | ICD-10-CM

## 2018-11-01 DIAGNOSIS — Z1231 Encounter for screening mammogram for malignant neoplasm of breast: Secondary | ICD-10-CM | POA: Diagnosis not present

## 2018-11-01 DIAGNOSIS — Z01419 Encounter for gynecological examination (general) (routine) without abnormal findings: Secondary | ICD-10-CM | POA: Diagnosis not present

## 2018-11-01 MED ORDER — AMPHETAMINE-DEXTROAMPHET ER 30 MG PO CP24: 30.0000 mg | ORAL_CAPSULE | Freq: Every day | ORAL | 0 refills | Status: DC

## 2018-11-01 NOTE — Progress Notes (Signed)
   Subjective:    Patient ID: Maureen White, female    DOB: 10/01/1966, 52 y.o.   MRN: [id number]  HPI She is here for recheck on ADHD.  She is on Vyvanse but states that she only gets roughly 4 hours with the benefit out of it.  She is on 70 mg pill.   Review of Systems     Objective:   Physical Exam Alert and in no distress otherwise not examined       Assessment & Plan:  ADHD (attention deficit hyperactivity disorder), predominantly hyperactive impulsive type - Plan: amphetamine-dextroamphetamine (ADDERALL XR) 30 MG 24 hr capsule I will switch her to Adderall XR 30.  She is to let me know if that works, how long it works and if she has any effects from that.  She is now living in [place].  Did suggest to get a physician in that area.

## 2018-11-02 ENCOUNTER — Encounter: Payer: Self-pay | Admitting: Family Medicine

## 2018-11-03 ENCOUNTER — Other Ambulatory Visit: Payer: Self-pay | Admitting: Obstetrics and Gynecology

## 2018-11-03 DIAGNOSIS — R928 Other abnormal and inconclusive findings on diagnostic imaging of breast: Secondary | ICD-10-CM

## 2018-11-10 ENCOUNTER — Other Ambulatory Visit: Payer: Managed Care, Other (non HMO)

## 2018-11-18 ENCOUNTER — Other Ambulatory Visit: Payer: Managed Care, Other (non HMO)

## 2018-11-19 ENCOUNTER — Ambulatory Visit: Payer: Managed Care, Other (non HMO)

## 2018-11-19 ENCOUNTER — Ambulatory Visit
Admission: RE | Admit: 2018-11-19 | Discharge: 2018-11-19 | Disposition: A | Discharge status: Home / Self Care | Payer: 59 | Source: Ambulatory Visit | Attending: Obstetrics and Gynecology | Admitting: Obstetrics and Gynecology

## 2018-11-19 DIAGNOSIS — R922 Inconclusive mammogram: Secondary | ICD-10-CM | POA: Diagnosis not present

## 2018-11-19 DIAGNOSIS — R928 Other abnormal and inconclusive findings on diagnostic imaging of breast: Secondary | ICD-10-CM

## 2019-02-08 ENCOUNTER — Other Ambulatory Visit: Payer: Self-pay

## 2019-02-08 DIAGNOSIS — F901 Attention-deficit hyperactivity disorder, predominantly hyperactive type: Secondary | ICD-10-CM

## 2019-02-08 MED ORDER — AMPHETAMINE-DEXTROAMPHET ER 30 MG PO CP24: 30.0000 mg | ORAL_CAPSULE | ORAL | 0 refills | Status: DC

## 2019-02-08 MED ORDER — AMPHETAMINE-DEXTROAMPHET ER 30 MG PO CP24: 30.0000 mg | ORAL_CAPSULE | Freq: Every day | ORAL | 0 refills | Status: DC

## 2019-02-08 NOTE — Telephone Encounter (Signed)
Patient called to request a refill on the pending medication  

## 2019-03-04 DIAGNOSIS — F411 Generalized anxiety disorder: Secondary | ICD-10-CM | POA: Diagnosis not present

## 2019-03-11 DIAGNOSIS — F411 Generalized anxiety disorder: Secondary | ICD-10-CM | POA: Diagnosis not present

## 2019-03-17 DIAGNOSIS — F411 Generalized anxiety disorder: Secondary | ICD-10-CM | POA: Diagnosis not present

## 2019-03-31 DIAGNOSIS — F411 Generalized anxiety disorder: Secondary | ICD-10-CM | POA: Diagnosis not present

## 2019-04-14 DIAGNOSIS — F411 Generalized anxiety disorder: Secondary | ICD-10-CM | POA: Diagnosis not present

## 2019-05-05 DIAGNOSIS — F411 Generalized anxiety disorder: Secondary | ICD-10-CM | POA: Diagnosis not present

## 2019-05-12 DIAGNOSIS — F411 Generalized anxiety disorder: Secondary | ICD-10-CM | POA: Diagnosis not present

## 2019-05-18 DIAGNOSIS — F411 Generalized anxiety disorder: Secondary | ICD-10-CM | POA: Diagnosis not present

## 2019-05-27 ENCOUNTER — Telehealth: Payer: Self-pay | Admitting: Family Medicine

## 2019-05-27 DIAGNOSIS — F901 Attention-deficit hyperactivity disorder, predominantly hyperactive type: Secondary | ICD-10-CM

## 2019-05-27 DIAGNOSIS — F411 Generalized anxiety disorder: Secondary | ICD-10-CM | POA: Diagnosis not present

## 2019-05-27 MED ORDER — AMPHETAMINE-DEXTROAMPHET ER 30 MG PO CP24: 30.0000 mg | ORAL_CAPSULE | Freq: Every day | ORAL | 0 refills | Status: DC

## 2019-05-27 MED ORDER — AMPHETAMINE-DEXTROAMPHET ER 30 MG PO CP24: 30.0000 mg | ORAL_CAPSULE | ORAL | 0 refills | Status: DC

## 2019-05-27 NOTE — Telephone Encounter (Signed)
Pt called and is requesting  A refill on her adderall pt needs it to go the [id number]

## 2019-06-30 DIAGNOSIS — F411 Generalized anxiety disorder: Secondary | ICD-10-CM | POA: Diagnosis not present

## 2019-07-01 ENCOUNTER — Telehealth: Payer: Self-pay | Admitting: Family Medicine

## 2019-07-01 NOTE — Telephone Encounter (Signed)
It is already at the store

## 2019-07-01 NOTE — Telephone Encounter (Signed)
Pt called and is requesting a refill on her adderall pt needs it to go to the [id number], [place]

## 2019-07-01 NOTE — Telephone Encounter (Signed)
Pt informed

## 2019-07-29 DIAGNOSIS — F411 Generalized anxiety disorder: Secondary | ICD-10-CM | POA: Diagnosis not present

## 2019-08-12 DIAGNOSIS — F411 Generalized anxiety disorder: Secondary | ICD-10-CM | POA: Diagnosis not present

## 2019-08-24 ENCOUNTER — Other Ambulatory Visit: Payer: Self-pay

## 2019-08-24 DIAGNOSIS — Z20822 Contact with and (suspected) exposure to covid-19: Secondary | ICD-10-CM

## 2019-08-26 LAB — NOVEL CORONAVIRUS, NAA: SARS-CoV-2, NAA: NOT DETECTED

## 2019-09-08 DIAGNOSIS — F411 Generalized anxiety disorder: Secondary | ICD-10-CM | POA: Diagnosis not present

## 2019-09-16 ENCOUNTER — Telehealth: Payer: Self-pay | Admitting: Family Medicine

## 2019-09-16 DIAGNOSIS — F901 Attention-deficit hyperactivity disorder, predominantly hyperactive type: Secondary | ICD-10-CM

## 2019-09-16 DIAGNOSIS — F411 Generalized anxiety disorder: Secondary | ICD-10-CM | POA: Diagnosis not present

## 2019-09-16 MED ORDER — AMPHETAMINE-DEXTROAMPHET ER 30 MG PO CP24: 30.0000 mg | ORAL_CAPSULE | ORAL | 0 refills | Status: DC

## 2019-09-16 MED ORDER — AMPHETAMINE-DEXTROAMPHET ER 30 MG PO CP24: 30.0000 mg | ORAL_CAPSULE | Freq: Every day | ORAL | 0 refills | Status: DC

## 2019-09-16 NOTE — Telephone Encounter (Signed)
PT called and needs refill on Adderall sent to the [facility] on [address].

## 2019-10-05 ENCOUNTER — Encounter: Payer: Self-pay | Admitting: Family Medicine

## 2019-10-05 ENCOUNTER — Other Ambulatory Visit: Payer: Self-pay

## 2019-10-05 ENCOUNTER — Ambulatory Visit: Payer: BC Managed Care – PPO | Admitting: Family Medicine

## 2019-10-05 VITALS — BP 110/68 | HR 80 | Temp 98.4°F | Wt 129.8 lb

## 2019-10-05 DIAGNOSIS — F901 Attention-deficit hyperactivity disorder, predominantly hyperactive type: Secondary | ICD-10-CM

## 2019-10-05 MED ORDER — DEXMETHYLPHENIDATE HCL ER 10 MG PO CP24: 10.0000 mg | ORAL_CAPSULE | Freq: Every day | ORAL | 0 refills | Status: DC
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
## 2019-10-06 DIAGNOSIS — F411 Generalized anxiety disorder: Secondary | ICD-10-CM | POA: Diagnosis not present

## 2019-10-20 DIAGNOSIS — F411 Generalized anxiety disorder: Secondary | ICD-10-CM | POA: Diagnosis not present

## 2019-11-17 DIAGNOSIS — F411 Generalized anxiety disorder: Secondary | ICD-10-CM | POA: Diagnosis not present

## 2019-11-24 ENCOUNTER — Other Ambulatory Visit: Payer: Self-pay

## 2019-11-24 DIAGNOSIS — Z20822 Contact with and (suspected) exposure to covid-19: Secondary | ICD-10-CM

## 2019-11-27 LAB — NOVEL CORONAVIRUS, NAA: SARS-CoV-2, NAA: NOT DETECTED

## 2019-12-22 ENCOUNTER — Telehealth: Payer: Self-pay | Admitting: Family Medicine

## 2019-12-22 MED ORDER — DEXMETHYLPHENIDATE HCL ER 15 MG PO CP24: 15.0000 mg | ORAL_CAPSULE | Freq: Every day | ORAL | 0 refills | Status: DC

## 2019-12-22 NOTE — Telephone Encounter (Signed)
Pt called for refills of focalin XR. She states that dose may need to be slightly increased. Pt uses [facility] on [address]. Pt pt can be reached at [phone].

## 2020-01-06 ENCOUNTER — Encounter: Payer: Self-pay | Admitting: Family Medicine

## 2020-01-06 DIAGNOSIS — F411 Generalized anxiety disorder: Secondary | ICD-10-CM | POA: Diagnosis not present

## 2020-01-09 ENCOUNTER — Telehealth: Payer: Self-pay

## 2020-01-09 DIAGNOSIS — F411 Generalized anxiety disorder: Secondary | ICD-10-CM | POA: Diagnosis not present

## 2020-01-09 NOTE — Telephone Encounter (Signed)
Called pt to find out to more info concerning the letter . No answer and drafted letter with office note . Will wait to hear from pt and letter will be emailed then. [place]

## 2020-01-09 NOTE — Telephone Encounter (Signed)
Error

## 2020-01-16 ENCOUNTER — Telehealth: Payer: Self-pay
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
## 2020-01-25 ENCOUNTER — Telehealth: Payer: Self-pay | Admitting: Family Medicine

## 2020-01-25 DIAGNOSIS — F901 Attention-deficit hyperactivity disorder, predominantly hyperactive type: Secondary | ICD-10-CM

## 2020-01-25 MED ORDER — AMPHETAMINE-DEXTROAMPHET ER 30 MG PO CP24: 30.0000 mg | ORAL_CAPSULE | ORAL | 0 refills | Status: DC

## 2020-01-25 MED ORDER — AMPHETAMINE-DEXTROAMPHET ER 30 MG PO CP24: 30.0000 mg | ORAL_CAPSULE | Freq: Every day | ORAL | 0 refills | Status: DC

## 2020-01-25 NOTE — Telephone Encounter (Signed)
Pt called and needs refill on Focalin sent to the [facility] on [address]

## 2020-01-25 NOTE — Telephone Encounter (Signed)
She states that the Adderall seems to work the best and I will therefore call that medication and not the Focalin

## 2020-02-10 DIAGNOSIS — F411 Generalized anxiety disorder: Secondary | ICD-10-CM | POA: Diagnosis not present

## 2020-03-06 DIAGNOSIS — Z79899 Other long term (current) drug therapy: Secondary | ICD-10-CM | POA: Diagnosis not present

## 2020-03-06 DIAGNOSIS — F419 Anxiety disorder, unspecified: Secondary | ICD-10-CM | POA: Diagnosis not present

## 2020-03-06 DIAGNOSIS — R41840 Attention and concentration deficit: Secondary | ICD-10-CM | POA: Diagnosis not present

## 2020-03-06 DIAGNOSIS — F902 Attention-deficit hyperactivity disorder, combined type: Secondary | ICD-10-CM | POA: Diagnosis not present

## 2020-03-22 DIAGNOSIS — F411 Generalized anxiety disorder: Secondary | ICD-10-CM | POA: Diagnosis not present

## 2020-04-11 DIAGNOSIS — F902 Attention-deficit hyperactivity disorder, combined type: Secondary | ICD-10-CM | POA: Diagnosis not present

## 2020-04-11 DIAGNOSIS — R41840 Attention and concentration deficit: Secondary | ICD-10-CM | POA: Diagnosis not present

## 2020-04-11 DIAGNOSIS — F419 Anxiety disorder, unspecified: Secondary | ICD-10-CM | POA: Diagnosis not present

## 2020-04-11 DIAGNOSIS — Z79899 Other long term (current) drug therapy: Secondary | ICD-10-CM | POA: Diagnosis not present

## 2020-05-04 DIAGNOSIS — M9903 Segmental and somatic dysfunction of lumbar region: Secondary | ICD-10-CM | POA: Diagnosis not present

## 2020-05-04 DIAGNOSIS — M19072 Primary osteoarthritis, left ankle and foot: Secondary | ICD-10-CM | POA: Diagnosis not present

## 2020-05-04 DIAGNOSIS — M19071 Primary osteoarthritis, right ankle and foot: Secondary | ICD-10-CM | POA: Diagnosis not present

## 2020-05-04 DIAGNOSIS — M9902 Segmental and somatic dysfunction of thoracic region: Secondary | ICD-10-CM | POA: Diagnosis not present

## 2020-05-04 DIAGNOSIS — M9905 Segmental and somatic dysfunction of pelvic region: Secondary | ICD-10-CM | POA: Diagnosis not present

## 2020-05-04 DIAGNOSIS — M62830 Muscle spasm of back: Secondary | ICD-10-CM | POA: Diagnosis not present

## 2020-06-14 DIAGNOSIS — F411 Generalized anxiety disorder: Secondary | ICD-10-CM | POA: Diagnosis not present

## 2020-07-30 DIAGNOSIS — M25572 Pain in left ankle and joints of left foot: Secondary | ICD-10-CM | POA: Diagnosis not present

## 2020-08-02 ENCOUNTER — Telehealth: Payer: Self-pay | Admitting: Family Medicine

## 2020-08-02 DIAGNOSIS — F411 Generalized anxiety disorder: Secondary | ICD-10-CM | POA: Diagnosis not present

## 2020-08-02 DIAGNOSIS — F901 Attention-deficit hyperactivity disorder, predominantly hyperactive type: Secondary | ICD-10-CM

## 2020-08-02 MED ORDER — AMPHETAMINE-DEXTROAMPHET ER 30 MG PO CP24: 30.0000 mg | ORAL_CAPSULE | Freq: Every day | ORAL | 0 refills | Status: DC

## 2020-08-02 MED ORDER — AMPHETAMINE-DEXTROAMPHET ER 30 MG PO CP24: 30.0000 mg | ORAL_CAPSULE | ORAL | 0 refills | Status: DC

## 2020-08-02 NOTE — Telephone Encounter (Signed)
Pt called for refills on Adderall XR. Please send to [facility] [address]. Pt can be reached at [phone]

## 2020-08-21 DIAGNOSIS — Z6823 Body mass index (BMI) 23.0-23.9, adult: Secondary | ICD-10-CM | POA: Diagnosis not present

## 2020-08-21 DIAGNOSIS — Z1321 Encounter for screening for nutritional disorder: Secondary | ICD-10-CM | POA: Diagnosis not present

## 2020-08-21 DIAGNOSIS — F419 Anxiety disorder, unspecified: Secondary | ICD-10-CM | POA: Diagnosis not present

## 2020-08-21 DIAGNOSIS — Z13220 Encounter for screening for lipoid disorders: Secondary | ICD-10-CM | POA: Diagnosis not present

## 2020-08-21 DIAGNOSIS — Z1329 Encounter for screening for other suspected endocrine disorder: Secondary | ICD-10-CM | POA: Diagnosis not present

## 2020-08-21 DIAGNOSIS — Z01419 Encounter for gynecological examination (general) (routine) without abnormal findings: Secondary | ICD-10-CM | POA: Diagnosis not present

## 2020-08-21 DIAGNOSIS — Z1231 Encounter for screening mammogram for malignant neoplasm of breast: Secondary | ICD-10-CM | POA: Diagnosis not present

## 2020-08-21 DIAGNOSIS — Z13228 Encounter for screening for other metabolic disorders: Secondary | ICD-10-CM | POA: Diagnosis not present

## 2020-08-21 LAB — HM PAP SMEAR: HM Pap smear: NEGATIVE

## 2020-08-21 LAB — HM MAMMOGRAPHY

## 2020-09-05 ENCOUNTER — Encounter: Payer: Self-pay | Admitting: Family Medicine

## 2020-09-06 DIAGNOSIS — F411 Generalized anxiety disorder: Secondary | ICD-10-CM | POA: Diagnosis not present

## 2020-09-11 ENCOUNTER — Encounter: Payer: Self-pay | Admitting: Family Medicine

## 2020-09-14 ENCOUNTER — Telehealth: Payer: Self-pay | Admitting: Family Medicine

## 2020-09-14 DIAGNOSIS — F411 Generalized anxiety disorder: Secondary | ICD-10-CM | POA: Diagnosis not present

## 2020-09-14 MED ORDER — AMPHETAMINE-DEXTROAMPHET ER 30 MG PO CP24
30.0000 mg | ORAL_CAPSULE | Freq: Every day | ORAL | 0 refills | Status: DC
Start: 2020-10-02 — End: 2020-12-04

## 2020-09-14 NOTE — Telephone Encounter (Signed)
Pt needs refill on Adderall sent [facility]

## 2020-10-31 DIAGNOSIS — F419 Anxiety disorder, unspecified: Secondary | ICD-10-CM | POA: Diagnosis not present

## 2020-10-31 DIAGNOSIS — F902 Attention-deficit hyperactivity disorder, combined type: Secondary | ICD-10-CM | POA: Diagnosis not present

## 2020-10-31 DIAGNOSIS — Z79899 Other long term (current) drug therapy: Secondary | ICD-10-CM | POA: Diagnosis not present

## 2020-12-04 ENCOUNTER — Other Ambulatory Visit: Payer: Self-pay

## 2020-12-04 ENCOUNTER — Encounter: Payer: Self-pay | Admitting: Adult Health

## 2020-12-04 ENCOUNTER — Ambulatory Visit (INDEPENDENT_AMBULATORY_CARE_PROVIDER_SITE_OTHER): Payer: 59 | Admitting: Adult Health

## 2020-12-04 VITALS — BP 136/83 | HR 72 | Ht 64.0 in | Wt 134.0 lb

## 2020-12-04 DIAGNOSIS — F411 Generalized anxiety disorder: Secondary | ICD-10-CM

## 2020-12-04 DIAGNOSIS — F1020 Alcohol dependence, uncomplicated: Secondary | ICD-10-CM

## 2020-12-04 MED ORDER — VENLAFAXINE HCL ER 75 MG PO CP24: 75.0000 mg | ORAL_CAPSULE | Freq: Every day | ORAL | 2 refills | Status: DC

## 2020-12-04 MED ORDER — VENLAFAXINE HCL ER 37.5 MG PO CP24: 37.5000 mg | ORAL_CAPSULE | Freq: Every day | ORAL | 2 refills | Status: DC

## 2020-12-04 MED ORDER — NALTREXONE HCL 50 MG PO TABS: 50.0000 mg | ORAL_TABLET | Freq: Every day | ORAL | 0 refills | Status: DC

## 2020-12-04 NOTE — Progress Notes (Signed)
[facility] MD/PA/NP Initial Note  12/04/2020 11:16 AM Maureen White  MRN:  [id number]  Chief Complaint:   HPI:   Describes mood today as "ok". Pleasant. Denies tearfulness. Mood symptoms - depressed some days - gets "triggered". Feels anxious "most of the time". Denies irritability. Has been working on decreased alcohol use. Partner feels like she has been drinking too much. Started drinking more a few months ago. Reports drinking after work and on the weekends. Stating "I was drinking to calm the anxiety". Has reduced use to drinking one night a week and on Friday and Saturday nights. Does not feel like current medication is helping to manage the anxiety. Is willing to start a taper off the Effexor and try a different medication. Stable interest and motivation. Taking medications as prescribed. Energy levels stable. Active, has a regular exercise routine. Working with a Clinical research associate.  Enjoys some usual interests and activities. Single. Lives alone with dog. Dating. Brother in [place] and [place]. One brother deceased. Parents deceased. Spending time with family. Appetite adequate. Weight loss - 3 pounds - 138 pounds.  Sleeps well most nights. Averages 7 to 8  hours with 2mg  of Ativan. Focus and concentration better with Vyvanse - "not great". Completing tasks. Managing aspects of household. Work full time - Personal assistant. Denies SI or HI.  Denies AH or VH.  Previous medication trials: Wellbutrin, Adderall  Visit Diagnosis:    ICD-10-CM   1. Generalized anxiety disorder  F41.1 venlafaxine XR (EFFEXOR XR) 37.5 MG 24 hr capsule    venlafaxine XR (EFFEXOR XR) 75 MG 24 hr capsule  2. Uncomplicated alcohol dependence ([place])  F10.20     Past Psychiatric History: Denies psychiatric hospitalization.  Past Medical History: No past medical history on file. No past surgical history on file.  Family Psychiatric History: Family members with mental health issues.  Family History:  Family History   Problem Relation Age of Onset   Breast cancer Maternal Aunt        late 1's    Social History:  Social History   Socioeconomic History   Marital status: Single    Spouse name: Not on file   Number of children: Not on file   Years of education: Not on file   Highest education level: Not on file  Occupational History   Not on file  Tobacco Use   Smoking status: Never Smoker   Smokeless tobacco: Never Used  Substance and Sexual Activity   Alcohol use: Yes    Alcohol/week: 0.0 standard drinks    Comment: weekends usually   Drug use: No   Sexual activity: Not on file  Other Topics Concern   Not on file  Social History Narrative   Not on file   Social Determinants of Health   Financial Resource Strain: Not on file  Food Insecurity: Not on file  Transportation Needs: Not on file  Physical Activity: Not on file  Stress: Not on file  Social Connections: Not on file    Allergies: No Known Allergies  Metabolic Disorder Labs: No results found for: HGBA1C, MPG No results found for: PROLACTIN No results found for: CHOL, TRIG, HDL, CHOLHDL, VLDL, LDLCALC No results found for: TSH  Therapeutic Level Labs: No results found for: LITHIUM No results found for: VALPROATE No components found for:  CBMZ  Current Medications: Current Outpatient Medications  Medication Sig Dispense Refill   guanFACINE (INTUNIV) 1 MG TB24 ER tablet Take 1 mg by mouth daily.  lisdexamfetamine (VYVANSE) 70 MG capsule Take 1 capsule (70 mg total) by mouth daily. (Patient not taking: Reported on 10/05/2019) 30 capsule 0   LORazepam (ATIVAN) 1 MG tablet Take 1 tablet (1 mg total) by mouth 2 (two) times daily as needed for anxiety. 50 tablet 0   LORazepam (ATIVAN) 2 MG tablet Take 2 mg by mouth 2 (two) times daily as needed.     naltrexone (DEPADE) 50 MG tablet Take 1 tablet (50 mg total) by mouth daily. 30 tablet 0   neomycin-polymyxin b-dexamethasone (MAXITROL) 3.5-10000-0.1  SUSP SMARTSIG:In Eye(s)     triamcinolone (KENALOG) 0.1 % SMARTSIG:1 Application Topical 2-3 Times Daily     valACYclovir (VALTREX) 1000 MG tablet Take 2 pills twice a day for one day at the onset of a blister 12 tablet 1   venlafaxine XR (EFFEXOR XR) 37.5 MG 24 hr capsule Take 1 capsule (37.5 mg total) by mouth daily with breakfast. 30 capsule 2   venlafaxine XR (EFFEXOR XR) 75 MG 24 hr capsule Take 1 capsule (75 mg total) by mouth daily with breakfast. 30 capsule 2   venlafaxine XR (EFFEXOR-XR) 150 MG 24 hr capsule Take 150 mg by mouth daily.     No current facility-administered medications for this visit.    Medication Side Effects: none  Orders placed this visit:  No orders of the defined types were placed in this encounter.   Psychiatric Specialty Exam:  Review of Systems  Musculoskeletal: Negative for gait problem.  Neurological: Negative for tremors.  Psychiatric/Behavioral:       Please refer to HPI    There were no vitals taken for this visit.There is no height or weight on file to calculate BMI.  General Appearance: Neat and Well Groomed  Eye Contact:  Good  Speech:  Clear and Coherent and Normal Rate  Volume:  Normal  Mood:  Euthymic  Affect:  Appropriate and Congruent  Thought Process:  Coherent and Descriptions of Associations: Intact  Orientation:  Full (Time, Place, and Person)  Thought Content: Logical   Suicidal Thoughts:  No  Homicidal Thoughts:  No  Memory:  WNL  Judgement:  Good  Insight:  Good  Psychomotor Activity:  Normal  Concentration:  Concentration: Good  Recall:  Good  Fund of Knowledge: Good  Language: Good  Assets:  Communication Skills Desire for Improvement Financial Resources/Insurance Housing Intimacy Leisure Time Physical Health Resilience Social Support Talents/Skills Transportation Vocational/Educational  ADL's:  Intact  Cognition: WNL  Prognosis:  Good   Screenings:  PHQ2-9   [facility] Office Visit from  06/30/2017 in [facility]  PHQ-2 Total Score 0      Receiving Psychotherapy: Yes [employer]  Treatment Plan/Recommendations:   Plan:  PDMP reviewed  1. Effexor XR 150mg  every morning - will start taper off of medication- 75mg  and 37.5mg  scripts went in.  2. Vyvanse 70mg  daily  3. Lorazepam 2mg  at bedtime - prescribed BID 4. Guafincine 1mg  at hs 5. Add Naltrexone 50mg  daily  Plan to add SSRI for anxiety next visit  Consider restarting Wellbutrin  Greater than 50% of face to face time with patient was spent on counseling and coordination of care. We discussed medication changes and goals, continue therapy, alcohol cessation.  RTC 4 weeks  Patient advised to contact office with any questions, adverse effects, or acute worsening in signs and symptoms.  Discussed potential benefits, risk, and side effects of benzodiazepines to include potential risk of tolerance and dependence, as well as possible  drowsiness.  Advised patient not to drive if experiencing drowsiness and to take lowest possible effective dose to minimize risk of dependence and tolerance.  Discussed potential benefits, risks, and side effects of stimulants with patient to include increased heart rate, palpitations, insomnia, increased anxiety, increased irritability, or decreased appetite.  Instructed patient to contact office if experiencing any significant tolerability issues.       Aloha Gell, NP

## 2020-12-06 ENCOUNTER — Other Ambulatory Visit: Payer: Self-pay | Admitting: Adult Health

## 2020-12-06 DIAGNOSIS — F1020 Alcohol dependence, uncomplicated: Secondary | ICD-10-CM

## 2020-12-06 MED ORDER — NALTREXONE HCL 50 MG PO TABS: 50.0000 mg | ORAL_TABLET | Freq: Every day | ORAL | 0 refills | Status: DC

## 2021-01-02 ENCOUNTER — Encounter: Payer: Self-pay | Admitting: Adult Health

## 2021-01-02 ENCOUNTER — Ambulatory Visit (INDEPENDENT_AMBULATORY_CARE_PROVIDER_SITE_OTHER): Payer: 59 | Admitting: Adult Health

## 2021-01-02 ENCOUNTER — Other Ambulatory Visit: Payer: Self-pay

## 2021-01-02 DIAGNOSIS — F411 Generalized anxiety disorder: Secondary | ICD-10-CM | POA: Diagnosis not present

## 2021-01-02 DIAGNOSIS — F901 Attention-deficit hyperactivity disorder, predominantly hyperactive type: Secondary | ICD-10-CM | POA: Diagnosis not present

## 2021-01-02 DIAGNOSIS — F331 Major depressive disorder, recurrent, moderate: Secondary | ICD-10-CM | POA: Diagnosis not present

## 2021-01-02 MED ORDER — ESCITALOPRAM OXALATE 10 MG PO TABS: 10.0000 mg | ORAL_TABLET | Freq: Every day | ORAL | 5 refills | Status: DC

## 2021-01-02 MED ORDER — BUPROPION HCL ER (XL) 150 MG PO TB24: ORAL_TABLET | ORAL | 5 refills | Status: DC

## 2021-01-02 MED ORDER — LORAZEPAM 2 MG PO TABS: 2.0000 mg | ORAL_TABLET | Freq: Two times a day (BID) | ORAL | 2 refills | Status: DC | PRN

## 2021-01-02 MED ORDER — LISDEXAMFETAMINE DIMESYLATE 70 MG PO CAPS: 70.0000 mg | ORAL_CAPSULE | Freq: Every day | ORAL | 0 refills | Status: DC

## 2021-01-02 NOTE — Progress Notes (Signed)
Maureen White [id number] [birth year] 55 y.o.  Subjective:   Patient ID:  Maureen White is a 55 y.o. (DOB 19-Aug-1966) female.  Chief Complaint: No chief complaint on file.   HPI Aalliyah Kilker presents to the office today for follow-up of MDD, GAD, and ADHD.  Describes mood today as "ok". Pleasant. Denies tearfulness. Mood symptoms - decreased depressive symptoms. Denies irritability. Feels more anxious overall. Has tapered down off the Effexor - currently finishing final week of the 37.5mg  dose. Denies any untoward side effects with taper. Willing to start trial of Lexapro 10mg  to help manage anxiety symptoms. Would also like to restart the Wellbutrin - has worked well in the past. Has not started Naltrexone, but has decreased alcohol use. Work going well. Kitchen remodel. Stable interest and motivation. Taking medications as prescribed. Energy levels stable. Active, has a regular exercise routine.   Enjoys some usual interests and activities. Single. Lives alone with dog. Dating. Brother in [place] and [place]. Spending time with family. Appetite adequate. Weight stable. Sleeps well most nights. Averages 7 to 8 hours. Focus and concentration improved with Vyvanse. Completing tasks. Managing aspects of household. 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  Denies AH or VH.  Previous medication trials: Wellbutrin, Adderall, Prozac, Zoloft,    PHQ2-9   Flowsheet Row Office Visit from 06/30/2017 in [facility]  PHQ-2 Total Score 0       Review of Systems:  Review of Systems  Musculoskeletal: Negative for gait problem.  Neurological: Negative for tremors.  Psychiatric/Behavioral:       Please refer to HPI    Medications: I have reviewed the patient's current medications.  Current Outpatient Medications  Medication Sig Dispense Refill  . buPROPion (WELLBUTRIN XL) 150 MG 24 hr tablet Take one tablet every morning for 7 days, then take two tablets every  morning. 60 tablet 5  . escitalopram (LEXAPRO) 10 MG tablet Take 1 tablet (10 mg total) by mouth daily. 30 tablet 5  . guanFACINE (INTUNIV) 1 MG TB24 ER tablet Take 1 mg by mouth daily.    [place] lisdexamfetamine (VYVANSE) 70 MG capsule Take 1 capsule (70 mg total) by mouth daily. 30 capsule 0  . LORazepam (ATIVAN) 1 MG tablet Take 1 tablet (1 mg total) by mouth 2 (two) times daily as needed for anxiety. 50 tablet 0  . LORazepam (ATIVAN) 2 MG tablet Take 1 tablet (2 mg total) by mouth 2 (two) times daily as needed. 60 tablet 2  . naltrexone (DEPADE) 50 MG tablet Take 1 tablet (50 mg total) by mouth daily. 30 tablet 0  . neomycin-polymyxin b-dexamethasone (MAXITROL) 3.5-10000-0.1 SUSP SMARTSIG:In Eye(s)    . triamcinolone (KENALOG) 0.1 % SMARTSIG:1 Application Topical 2-3 Times Daily    . valACYclovir (VALTREX) 1000 MG tablet Take 2 pills twice a day for one day at the onset of a blister 12 tablet 1  . venlafaxine XR (EFFEXOR XR) 37.5 MG 24 hr capsule Take 1 capsule (37.5 mg total) by mouth daily with breakfast. 30 capsule 2  . venlafaxine XR (EFFEXOR XR) 75 MG 24 hr capsule Take 1 capsule (75 mg total) by mouth daily with breakfast. 30 capsule 2  . venlafaxine XR (EFFEXOR-XR) 150 MG 24 hr capsule Take 150 mg by mouth daily.     No current facility-administered medications for this visit.    Medication Side Effects: None  Allergies: No Known Allergies  No past medical history on file.  Family History  Problem Relation Age of Onset  . Breast cancer Maternal Aunt        late 30's    Social History   Socioeconomic History  . Marital status: Single    Spouse name: Not on file  . Number of children: Not on file  . Years of education: Not on file  . Highest education level: Not on file  Occupational History  . Not on file  Tobacco Use  . Smoking status: Never Smoker  . Smokeless tobacco: Never Used  Substance and Sexual Activity  . Alcohol use: Yes    Alcohol/week: 0.0 standard drinks     Comment: weekends usually  . Drug use: No  . Sexual activity: Not on file  Other Topics Concern  . Not on file  Social History Narrative  . Not on file   Social Determinants of Health   Financial Resource Strain: Not on file  Food Insecurity: Not on file  Transportation Needs: Not on file  Physical Activity: Not on file  Stress: Not on file  Social Connections: Not on file  Intimate Partner Violence: Not on file    Past Medical History, Surgical history, Social history, and Family history were reviewed and updated as appropriate.   Please see review of systems for further details on the patient's review from today.   Objective:   Physical Exam:  There were no vitals taken for this visit.  Physical Exam Constitutional:      General: She is not in acute distress. Musculoskeletal:        General: No deformity.  Neurological:     Mental Status: She is alert and oriented to person, place, and time.     Coordination: Coordination normal.  Psychiatric:        Attention and Perception: Attention and perception normal. She does not perceive auditory or visual hallucinations.        Mood and Affect: Mood normal. Mood is not anxious or depressed. Affect is not labile, blunt, angry or inappropriate.        Speech: Speech normal.        Behavior: Behavior normal.        Thought Content: Thought content normal. Thought content is not paranoid or delusional. Thought content does not include homicidal or suicidal ideation. Thought content does not include homicidal or suicidal plan.        Cognition and Memory: Cognition and memory normal.        Judgment: Judgment normal.     Comments: Insight intact     Lab Review:  No results found for: NA, K, CL, CO2, GLUCOSE, BUN, CREATININE, CALCIUM, PROT, ALBUMIN, AST, ALT, ALKPHOS, BILITOT, GFRNONAA, GFRAA  No results found for: WBC, RBC, HGB, HCT, PLT, MCV, MCH, MCHC, RDW, LYMPHSABS, MONOABS, EOSABS, BASOSABS  No results found for:  POCLITH, LITHIUM   No results found for: PHENYTOIN, PHENOBARB, VALPROATE, CBMZ   .res Assessment: Plan:    Plan:  PDMP reviewed  1. D/C Effexor XR 37.5mg  x 7 days 2. Continue Vyvanse 70mg  daily  3. Continue Lorazepam 2mg  at bedtime - prescribed BID - plans to taper down - discussed taper options. 4. D/C Guafincine 1mg  at hs - stopped  5. Hold Naltrexone 50mg  daily - has not started 6. Add Lexapro 10mg  daily 7. Add Wellbutrin XL 150mg  daily x 7 days, then increase to 300mg  daily. Denies seizure history.  108/70 - 55  RTC 4 weeks   Diagnoses and all orders for this visit:  ADHD (attention deficit hyperactivity disorder), predominantly hyperactive impulsive type -     lisdexamfetamine (VYVANSE) 70 MG capsule; Take 1 capsule (70 mg total) by mouth daily.  Generalized anxiety disorder -     escitalopram (LEXAPRO) 10 MG tablet; Take 1 tablet (10 mg total) by mouth daily. -     buPROPion (WELLBUTRIN XL) 150 MG 24 hr tablet; Take one tablet every morning for 7 days, then take two tablets every morning. -     LORazepam (ATIVAN) 2 MG tablet; Take 1 tablet (2 mg total) by mouth 2 (two) times daily as needed.  Major depressive disorder, recurrent episode, moderate (HCC) -     escitalopram (LEXAPRO) 10 MG tablet; Take 1 tablet (10 mg total) by mouth daily. -     buPROPion (WELLBUTRIN XL) 150 MG 24 hr tablet; Take one tablet every morning for 7 days, then take two tablets every morning. -     LORazepam (ATIVAN) 2 MG tablet; Take 1 tablet (2 mg total) by mouth 2 (two) times daily as needed.     Please see After Visit Summary for patient specific instructions.  Future Appointments  Date Time Provider [place]  01/30/2021  1:40 PM Keagan Anthis, Berdie Ogren, NP [facility] None    No orders of the defined types were placed in this encounter.   -------------------------------

## 2021-01-30 ENCOUNTER — Telehealth (INDEPENDENT_AMBULATORY_CARE_PROVIDER_SITE_OTHER): Payer: 59 | Admitting: Adult Health

## 2021-01-30 DIAGNOSIS — F339 Major depressive disorder, recurrent, unspecified: Secondary | ICD-10-CM | POA: Insufficient documentation

## 2021-01-30 DIAGNOSIS — F5104 Psychophysiologic insomnia: Secondary | ICD-10-CM | POA: Insufficient documentation

## 2021-01-30 DIAGNOSIS — F1020 Alcohol dependence, uncomplicated: Secondary | ICD-10-CM | POA: Diagnosis not present

## 2021-01-30 DIAGNOSIS — F331 Major depressive disorder, recurrent, moderate: Secondary | ICD-10-CM | POA: Diagnosis not present

## 2021-01-30 DIAGNOSIS — F411 Generalized anxiety disorder: Secondary | ICD-10-CM | POA: Diagnosis not present

## 2021-01-30 DIAGNOSIS — F901 Attention-deficit hyperactivity disorder, predominantly hyperactive type: Secondary | ICD-10-CM | POA: Diagnosis not present

## 2021-01-30 DIAGNOSIS — E785 Hyperlipidemia, unspecified: Secondary | ICD-10-CM | POA: Insufficient documentation

## 2021-01-30 MED ORDER — LISDEXAMFETAMINE DIMESYLATE 70 MG PO CAPS: 70.0000 mg | ORAL_CAPSULE | Freq: Every day | ORAL | 0 refills | Status: DC

## 2021-01-30 MED ORDER — NALTREXONE HCL 50 MG PO TABS
50.0000 mg | ORAL_TABLET | Freq: Every day | ORAL | 5 refills | Status: DC
Start: 2021-01-30 — End: 2021-07-17

## 2021-01-30 NOTE — Progress Notes (Signed)
Maureen White [id number] August 07, 1966 55 y.o.  Virtual Visit via Telephone Note  I connected with pt on 01/30/21 at  1:40 PM EST by telephone and verified that I am speaking with the correct person using two identifiers.   I discussed the limitations, risks, security and privacy concerns of performing an evaluation and management service by telephone and the availability of in person appointments. I also discussed with the patient that there may be a patient responsible charge related to this service. The patient expressed understanding and agreed to proceed.   I discussed the assessment and treatment plan with the patient. The patient was provided an opportunity to ask questions and all were answered. The patient agreed with the plan and demonstrated an understanding of the instructions.   The patient was advised to call back or seek an in-person evaluation if the symptoms worsen or if the condition fails to improve as anticipated.  I provided 30 minutes of non-face-to-face time during this encounter.  The patient was located at home.  The provider was located at [place].   Aloha Gell, NP   Subjective:   Patient ID:  Maureen White is a 55 y.o. (DOB 1966-06-21) female.  Chief Complaint: No chief complaint on file.   HPI Maureen White presents for follow-up of MDD, GAD, and ADHD.  Describes mood today as "ok". Pleasant. Mood symptoms - decreased depression and anxiety overall. Feels like addition of Lexapro and Wellbutrin are helping to "level" mood out more. Stating "I feel like I'm still in my head more at times". Continues to titrate upward on doses. Denies irritability. Has started Naltrexone and has decreased alcohol use. Stating "it tapers my desire to drink". Reporting some situational stressors. Working on home renovations. Work going well. Stable interest and motivation. Taking medications as prescribed. Energy levels stable. Active, has a regular  exercise routine.   Enjoys some usual interests and activities. Single.Dating. Lives alone with dog. Brothers in [place] and [place]. Spending time with family. Appetite adequate. Weight loss 3 pounds.  Sleeps well most nights. Averages 7 to 8 hours. Focus and concentration stable Vyvanse. Completing tasks. Managing aspects of household. 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  Denies AH or VH.  Previous medication trials: Wellbutrin, Adderall, Prozac, Zoloft,   Review of Systems:  Review of Systems  Musculoskeletal: Negative for gait problem.  Neurological: Negative for tremors.  Psychiatric/Behavioral:       Please refer to HPI    Medications: I have reviewed the patient's current medications.  Current Outpatient Medications  Medication Sig Dispense Refill  . [START ON 02/27/2021] lisdexamfetamine (VYVANSE) 70 MG capsule Take 1 capsule (70 mg total) by mouth daily. 30 capsule 0  . [START ON 03/27/2021] lisdexamfetamine (VYVANSE) 70 MG capsule Take 1 capsule (70 mg total) by mouth daily. 30 capsule 0  . buPROPion (WELLBUTRIN XL) 150 MG 24 hr tablet Take one tablet every morning for 7 days, then take two tablets every morning. 60 tablet 5  . escitalopram (LEXAPRO) 10 MG tablet Take 1 tablet (10 mg total) by mouth daily. 30 tablet 5  . lisdexamfetamine (VYVANSE) 70 MG capsule Take 1 capsule (70 mg total) by mouth daily. 30 capsule 0  . LORazepam (ATIVAN) 1 MG tablet Take 1 tablet (1 mg total) by mouth 2 (two) times daily as needed for anxiety. 50 tablet 0  . LORazepam (ATIVAN) 2 MG tablet Take 1 tablet (2 mg total) by mouth 2 (two) times  daily as needed. 60 tablet 2  . naltrexone (DEPADE) 50 MG tablet Take 1 tablet (50 mg total) by mouth daily. 30 tablet 5  . neomycin-polymyxin b-dexamethasone (MAXITROL) 3.5-10000-0.1 SUSP SMARTSIG:In Eye(s)    . triamcinolone (KENALOG) 0.1 % SMARTSIG:1 Application Topical 2-3 Times Daily    . valACYclovir (VALTREX) 1000 MG tablet Take 2 pills  twice a day for one day at the onset of a blister 12 tablet 1   No current facility-administered medications for this visit.    Medication Side Effects: None  Allergies: No Known Allergies  No past medical history on file.  Family History  Problem Relation Age of Onset  . Breast cancer Maternal Aunt        late 68's    Social History   Socioeconomic History  . Marital status: Single    Spouse name: Not on file  . Number of children: Not on file  . Years of education: Not on file  . Highest education level: Not on file  Occupational History  . Not on file  Tobacco Use  . Smoking status: Never Smoker  . Smokeless tobacco: Never Used  Substance and Sexual Activity  . Alcohol use: Yes    Alcohol/week: 0.0 standard drinks    Comment: weekends usually  . Drug use: No  . Sexual activity: Not on file  Other Topics Concern  . Not on file  Social History Narrative  . Not on file   Social Determinants of Health   Financial Resource Strain: Not on file  Food Insecurity: Not on file  Transportation Needs: Not on file  Physical Activity: Not on file  Stress: Not on file  Social Connections: Not on file  Intimate Partner Violence: Not on file    Past Medical History, Surgical history, Social history, and Family history were reviewed and updated as appropriate.   Please see review of systems for further details on the patient's review from today.   Objective:   Physical Exam:  There were no vitals taken for this visit.  Physical Exam Constitutional:      General: She is not in acute distress. Musculoskeletal:        General: No deformity.  Neurological:     Mental Status: She is alert and oriented to person, place, and time.     Coordination: Coordination normal.  Psychiatric:        Attention and Perception: Attention and perception normal. She does not perceive auditory or visual hallucinations.        Mood and Affect: Mood normal. Mood is not anxious or  depressed. Affect is not labile, blunt, angry or inappropriate.        Speech: Speech normal.        Behavior: Behavior normal.        Thought Content: Thought content normal. Thought content is not paranoid or delusional. Thought content does not include homicidal or suicidal ideation. Thought content does not include homicidal or suicidal plan.        Cognition and Memory: Cognition and memory normal.        Judgment: Judgment normal.     Comments: Insight intact     Lab Review:  No results found for: NA, K, CL, CO2, GLUCOSE, BUN, CREATININE, CALCIUM, PROT, ALBUMIN, AST, ALT, ALKPHOS, BILITOT, GFRNONAA, GFRAA  No results found for: WBC, RBC, HGB, HCT, PLT, MCV, MCH, MCHC, RDW, LYMPHSABS, MONOABS, EOSABS, BASOSABS  No results found for: POCLITH, LITHIUM   No results found for:  PHENYTOIN, PHENOBARB, VALPROATE, CBMZ   .res Assessment: Plan:    Plan:  PDMP reviewed  Continue Vyvanse 70mg  daily  Continue Lorazepam 2mg  at bedtime - prescribed BID  Continue Naltrexone 50mg  daily  Lexapro 5mg  to 10mg  daily - at 10mg  x 3 days Wellbutrin XL 150mg  daily plans to increase to 300mg  daily. Denies seizure history.   RTC 6 weeks  Discussed potential benefits, risk, and side effects of benzodiazepines to include potential risk of tolerance and dependence, as well as possible drowsiness.  Advised patient not to drive if experiencing drowsiness and to take lowest possible effective dose to minimize risk of dependence and tolerance.  Discussed potential benefits, risks, and side effects of stimulants with patient to include increased heart rate, palpitations, insomnia, increased anxiety, increased irritability, or decreased appetite.  Instructed patient to contact office if experiencing any significant tolerability issues.  Diagnoses and all orders for this visit:  ADHD (attention deficit hyperactivity disorder), predominantly hyperactive impulsive type -     lisdexamfetamine (VYVANSE) 70 MG  capsule; Take 1 capsule (70 mg total) by mouth daily. -     lisdexamfetamine (VYVANSE) 70 MG capsule; Take 1 capsule (70 mg total) by mouth daily. -     lisdexamfetamine (VYVANSE) 70 MG capsule; Take 1 capsule (70 mg total) by mouth daily.  Generalized anxiety disorder  Major depressive disorder, recurrent episode, moderate (HCC)  Uncomplicated alcohol dependence (HCC) -     naltrexone (DEPADE) 50 MG tablet; Take 1 tablet (50 mg total) by mouth daily.    Please see After Visit Summary for patient specific instru ctions.  No future appointments.  No orders of the defined types were placed in this encounter.     -------------------------------

## 2021-03-07 ENCOUNTER — Other Ambulatory Visit: Payer: Self-pay | Admitting: Adult Health

## 2021-03-07 DIAGNOSIS — F901 Attention-deficit hyperactivity disorder, predominantly hyperactive type: Secondary | ICD-10-CM

## 2021-03-07 NOTE — Telephone Encounter (Signed)
Controlled substance 

## 2021-04-10 ENCOUNTER — Telehealth: Payer: Self-pay | Admitting: Adult Health

## 2021-04-10 ENCOUNTER — Other Ambulatory Visit: Payer: Self-pay

## 2021-04-10 DIAGNOSIS — F411 Generalized anxiety disorder: Secondary | ICD-10-CM

## 2021-04-10 DIAGNOSIS — F331 Major depressive disorder, recurrent, moderate: Secondary | ICD-10-CM

## 2021-04-10 MED ORDER — BUPROPION HCL ER (XL) 300 MG PO TB24: 300.0000 mg | ORAL_TABLET | Freq: Every day | ORAL | 1 refills | Status: DC

## 2021-04-10 NOTE — Telephone Encounter (Signed)
Rx sent 

## 2021-04-10 NOTE — Telephone Encounter (Signed)
Please review

## 2021-04-10 NOTE — Telephone Encounter (Signed)
Pt left a message and said that she would like a new script of the wellbutrin 300 mg  Sent in . She is taking 2 150 mg tabs now and she said it would be easier to take one 300 mg. Please send to [facility]

## 2021-04-10 NOTE — Telephone Encounter (Signed)
Noted  

## 2021-04-10 NOTE — Telephone Encounter (Signed)
Ok to send

## 2021-04-26 ENCOUNTER — Telehealth: Payer: Self-pay | Admitting: Adult Health

## 2021-04-26 NOTE — Telephone Encounter (Signed)
Please review

## 2021-04-26 NOTE — Telephone Encounter (Signed)
Scripts sent

## 2021-04-26 NOTE — Telephone Encounter (Signed)
Paige LVM stating that she is out of town and didn't pickup medicine. She would like a prescription for 3 or pill 4 pills of wellbutrin sent to a local pharmacy or her own pharmacy [facility] [address] [place]. Pls RTC [phone]

## 2021-06-10 ENCOUNTER — Other Ambulatory Visit: Payer: Self-pay | Admitting: Adult Health

## 2021-06-10 DIAGNOSIS — F331 Major depressive disorder, recurrent, moderate: Secondary | ICD-10-CM

## 2021-06-10 DIAGNOSIS — F411 Generalized anxiety disorder: Secondary | ICD-10-CM

## 2021-06-12 NOTE — Telephone Encounter (Signed)
Please schedule appt

## 2021-06-14 NOTE — Telephone Encounter (Signed)
Pt has an appt on 7/6

## 2021-06-14 NOTE — Telephone Encounter (Signed)
Ativan last filled 05/21/21

## 2021-06-19 ENCOUNTER — Ambulatory Visit: Payer: 59 | Admitting: Adult Health

## 2021-07-08 ENCOUNTER — Other Ambulatory Visit: Payer: Self-pay | Admitting: Adult Health

## 2021-07-08 NOTE — Telephone Encounter (Signed)
Please schedule appt

## 2021-07-10 NOTE — Telephone Encounter (Signed)
Please approve

## 2021-07-10 NOTE — Telephone Encounter (Signed)
Pt has an appt for 8/3 please fill script

## 2021-07-17 ENCOUNTER — Other Ambulatory Visit: Payer: Self-pay

## 2021-07-17 ENCOUNTER — Encounter: Payer: Self-pay | Admitting: Adult Health

## 2021-07-17 ENCOUNTER — Other Ambulatory Visit: Payer: Self-pay | Admitting: Adult Health

## 2021-07-17 ENCOUNTER — Ambulatory Visit: Payer: 59 | Admitting: Adult Health

## 2021-07-17 DIAGNOSIS — F331 Major depressive disorder, recurrent, moderate: Secondary | ICD-10-CM

## 2021-07-17 DIAGNOSIS — F901 Attention-deficit hyperactivity disorder, predominantly hyperactive type: Secondary | ICD-10-CM | POA: Diagnosis not present

## 2021-07-17 DIAGNOSIS — F1020 Alcohol dependence, uncomplicated: Secondary | ICD-10-CM | POA: Diagnosis not present

## 2021-07-17 DIAGNOSIS — F411 Generalized anxiety disorder: Secondary | ICD-10-CM

## 2021-07-17 MED ORDER — LISDEXAMFETAMINE DIMESYLATE 70 MG PO CAPS: 70.0000 mg | ORAL_CAPSULE | Freq: Every day | ORAL | 0 refills | Status: DC

## 2021-07-17 MED ORDER — BUPROPION HCL ER (XL) 300 MG PO TB24: 300.0000 mg | ORAL_TABLET | Freq: Every day | ORAL | 5 refills | Status: DC

## 2021-07-17 MED ORDER — LORAZEPAM 2 MG PO TABS: 2.0000 mg | ORAL_TABLET | Freq: Two times a day (BID) | ORAL | 2 refills | Status: DC

## 2021-07-17 MED ORDER — ESCITALOPRAM OXALATE 10 MG PO TABS: 10.0000 mg | ORAL_TABLET | Freq: Every day | ORAL | 5 refills | Status: DC

## 2021-07-17 MED ORDER — NALTREXONE HCL 50 MG PO TABS: 50.0000 mg | ORAL_TABLET | Freq: Every day | ORAL | 5 refills | Status: DC

## 2021-07-17 NOTE — Progress Notes (Signed)
Maureen White [id number] Jul 03, 1966 55 y.o.  Subjective:   Patient ID:  Maureen White is a 55 y.o. (DOB [birth year]) female.  Chief Complaint: No chief complaint on file.   HPI Maureen White presents to the office today for follow-up of MDD, GAD, and ADHD.  Describes mood today as "ok". Pleasant. Mood symptoms - reports decreased depression and anxiety. Denies irritability. Reports worry and rumination. Stating "I'm having some struggles". Feels like medications are helpful. Finished with home renovations.Work going well. Stable interest and motivation. Taking medications as prescribed. Energy levels stable. Active, has a regular exercise routine.   Enjoys some usual interests and activities. Single.Lives alone with dog. Brothers in [place] and [place]. Spending time with family. Appetite adequate. Weight loss - 136 pounds. Sleeping better some nights than others. Averages 6 hours. Focus and concentration improved with Vyvanse. Completing tasks. Managing aspects of household. 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  Denies AH or VH.  Previous medication trials: Wellbutrin, Adderall, Prozac, Zoloft,    PHQ2-9    Flowsheet Row Office Visit from 06/30/2017 in [place]  PHQ-2 Total Score 0        Review of Systems:  Review of Systems  Musculoskeletal:  Negative for gait problem.  Neurological:  Negative for tremors.  Psychiatric/Behavioral:         Please refer to HPI   Medications: I have reviewed the patient's current medications.  Current Outpatient Medications  Medication Sig Dispense Refill   buPROPion (WELLBUTRIN XL) 300 MG 24 hr tablet Take 1 tablet (300 mg total) by mouth daily. 30 tablet 5   escitalopram (LEXAPRO) 10 MG tablet Take 1 tablet (10 mg total) by mouth daily. 30 tablet 5   lisdexamfetamine (VYVANSE) 70 MG capsule Take 1 capsule (70 mg total) by mouth daily. 30 capsule 0   [START ON 08/14/2021] lisdexamfetamine (VYVANSE) 70 MG  capsule Take 1 capsule (70 mg total) by mouth daily. 30 capsule 0   [START ON 09/11/2021] lisdexamfetamine (VYVANSE) 70 MG capsule Take 1 capsule (70 mg total) by mouth daily. 30 capsule 0   LORazepam (ATIVAN) 1 MG tablet Take 1 tablet (1 mg total) by mouth 2 (two) times daily as needed for anxiety. 50 tablet 0   LORazepam (ATIVAN) 2 MG tablet Take 1 tablet (2 mg total) by mouth 2 (two) times daily. 60 tablet 2   naltrexone (DEPADE) 50 MG tablet Take 1 tablet (50 mg total) by mouth daily. 30 tablet 5   neomycin-polymyxin b-dexamethasone (MAXITROL) 3.5-10000-0.1 SUSP SMARTSIG:In Eye(s)     triamcinolone (KENALOG) 0.1 % SMARTSIG:1 Application Topical 2-3 Times Daily     valACYclovir (VALTREX) 1000 MG tablet Take 2 pills twice a day for one day at the onset of a blister 12 tablet 1   VYVANSE 70 MG capsule 1 capsule by mouth daily 30 capsule 0   No current facility-administered medications for this visit.    Medication Side Effects: None  Allergies: No Known Allergies  No past medical history on file.  Past Medical History, Surgical history, Social history, and Family history were reviewed and updated as appropriate.   Please see review of systems for further details on the patient's review from today.   Objective:   Physical Exam:  There were no vitals taken for this visit.  Physical Exam Constitutional:      General: She is not in acute distress. Musculoskeletal:        General: No deformity.  Neurological:  Mental Status: She is alert and oriented to person, place, and time.     Coordination: Coordination normal.  Psychiatric:        Attention and Perception: Attention and perception normal. She does not perceive auditory or visual hallucinations.        Mood and Affect: Mood normal. Mood is not anxious or depressed. Affect is not labile, blunt, angry or inappropriate.        Speech: Speech normal.        Behavior: Behavior normal.        Thought Content: Thought content  normal. Thought content is not paranoid or delusional. Thought content does not include homicidal or suicidal ideation. Thought content does not include homicidal or suicidal plan.        Cognition and Memory: Cognition and memory normal.        Judgment: Judgment normal.     Comments: Insight intact    Lab Review:  No results found for: NA, K, CL, CO2, GLUCOSE, BUN, CREATININE, CALCIUM, PROT, ALBUMIN, AST, ALT, ALKPHOS, BILITOT, GFRNONAA, GFRAA  No results found for: WBC, RBC, HGB, HCT, PLT, MCV, MCH, MCHC, RDW, LYMPHSABS, MONOABS, EOSABS, BASOSABS  No results found for: POCLITH, LITHIUM   No results found for: PHENYTOIN, PHENOBARB, VALPROATE, CBMZ   .res Assessment: Plan:    Plan:  PDMP reviewed  Continue Vyvanse '70mg'$  daily  Continue Lorazepam '2mg'$  at bedtime - prescribed BID  Continue Naltrexone '50mg'$  daily  Lexapro '10mg'$  daily Wellbutrin XL '300mg'$  daily. Denies seizure history.  [id number]  RTC 6 months  Discussed potential benefits, risk, and side effects of benzodiazepines to include potential risk of tolerance and dependence, as well as possible drowsiness.  Advised patient not to drive if experiencing drowsiness and to take lowest possible effective dose to minimize risk of dependence and tolerance.  Discussed potential benefits, risks, and side effects of stimulants with patient to include increased heart rate, palpitations, insomnia, increased anxiety, increased irritability, or decreased appetite.  Instructed patient to contact office if experiencing any significant tolerability issues.   Diagnoses and all orders for this visit:  ADHD (attention deficit hyperactivity disorder), predominantly hyperactive impulsive type -     lisdexamfetamine (VYVANSE) 70 MG capsule; Take 1 capsule (70 mg total) by mouth daily. -     lisdexamfetamine (VYVANSE) 70 MG capsule; Take 1 capsule (70 mg total) by mouth daily. -     lisdexamfetamine (VYVANSE) 70 MG capsule; Take 1 capsule (70 mg  total) by mouth daily.  Uncomplicated alcohol dependence (HCC) -     naltrexone (DEPADE) 50 MG tablet; Take 1 tablet (50 mg total) by mouth daily.  Generalized anxiety disorder -     escitalopram (LEXAPRO) 10 MG tablet; Take 1 tablet (10 mg total) by mouth daily. -     LORazepam (ATIVAN) 2 MG tablet; Take 1 tablet (2 mg total) by mouth 2 (two) times daily.  Major depressive disorder, recurrent episode, moderate (HCC) -     escitalopram (LEXAPRO) 10 MG tablet; Take 1 tablet (10 mg total) by mouth daily. -     LORazepam (ATIVAN) 2 MG tablet; Take 1 tablet (2 mg total) by mouth 2 (two) times daily.  Other orders -     buPROPion (WELLBUTRIN XL) 300 MG 24 hr tablet; Take 1 tablet (300 mg total) by mouth daily.    Please see After Visit Summary for patient specific instructions.  Future Appointments  Date Time Provider [place]  11/27/2021  9:00 AM Romi Rathel, Berdie Ogren,  NP CP-CP None    No orders of the defined types were placed in this encounter.   -------------------------------

## 2021-09-05 ENCOUNTER — Other Ambulatory Visit: Payer: Self-pay | Admitting: Obstetrics and Gynecology

## 2021-09-05 DIAGNOSIS — Z8249 Family history of ischemic heart disease and other diseases of the circulatory system: Secondary | ICD-10-CM

## 2021-10-02 ENCOUNTER — Other Ambulatory Visit: Payer: Self-pay

## 2021-10-02 ENCOUNTER — Ambulatory Visit
Admission: RE | Admit: 2021-10-02 | Discharge: 2021-10-02 | Disposition: A | Discharge status: Home / Self Care | Payer: No Typology Code available for payment source | Source: Ambulatory Visit | Attending: Obstetrics and Gynecology | Admitting: Obstetrics and Gynecology

## 2021-10-02 DIAGNOSIS — Z8249 Family history of ischemic heart disease and other diseases of the circulatory system: Secondary | ICD-10-CM

## 2021-11-27 ENCOUNTER — Ambulatory Visit: Payer: 59 | Admitting: Adult Health

## 2021-11-27 NOTE — Progress Notes (Signed)
Patient called - sick with the flu.

## 2022-01-05 ENCOUNTER — Emergency Department (HOSPITAL_BASED_OUTPATIENT_CLINIC_OR_DEPARTMENT_OTHER): Payer: No Typology Code available for payment source

## 2022-01-05 ENCOUNTER — Emergency Department (HOSPITAL_BASED_OUTPATIENT_CLINIC_OR_DEPARTMENT_OTHER)
Admission: EM | Admit: 2022-01-05 | Discharge: 2022-01-05 | Disposition: A | Discharge status: Home / Self Care | Payer: No Typology Code available for payment source | Attending: Emergency Medicine | Admitting: Emergency Medicine

## 2022-01-05 ENCOUNTER — Encounter (HOSPITAL_BASED_OUTPATIENT_CLINIC_OR_DEPARTMENT_OTHER): Payer: Self-pay | Admitting: *Deleted

## 2022-01-05 ENCOUNTER — Other Ambulatory Visit: Payer: Self-pay

## 2022-01-05 DIAGNOSIS — S299XXA Unspecified injury of thorax, initial encounter: Secondary | ICD-10-CM | POA: Insufficient documentation

## 2022-01-05 DIAGNOSIS — Y92410 Unspecified street and highway as the place of occurrence of the external cause: Secondary | ICD-10-CM | POA: Diagnosis not present

## 2022-01-05 DIAGNOSIS — R0781 Pleurodynia: Secondary | ICD-10-CM

## 2022-01-05 MED ORDER — IBUPROFEN 800 MG PO TABS: 800.0000 mg | ORAL_TABLET | Freq: Four times a day (QID) | ORAL | 0 refills | Status: AC | PRN

## 2022-01-05 MED ORDER — IBUPROFEN 800 MG PO TABS
800.0000 mg | ORAL_TABLET | Freq: Once | ORAL | Status: AC
  Administered 2022-01-05: 800 mg via ORAL
  Filled 2022-01-05: qty 1

## 2022-01-05 MED ORDER — HYDROCODONE-ACETAMINOPHEN 5-325 MG PO TABS
1.0000 | ORAL_TABLET | Freq: Once | ORAL | Status: AC
  Administered 2022-01-05: 1 via ORAL
  Filled 2022-01-05: qty 1

## 2022-01-05 MED ORDER — IBUPROFEN 800 MG PO TABS: 800.0000 mg | ORAL_TABLET | Freq: Four times a day (QID) | ORAL | 0 refills | Status: DC | PRN

## 2022-01-05 MED ORDER — FENTANYL CITRATE PF 50 MCG/ML IJ SOSY
50.0000 ug | PREFILLED_SYRINGE | Freq: Once | INTRAMUSCULAR | Status: AC
  Administered 2022-01-05: 50 ug via INTRAMUSCULAR
  Filled 2022-01-05: qty 1

## 2022-01-05 MED ORDER — HYDROCODONE-ACETAMINOPHEN 5-325 MG PO TABS: 1.0000 | ORAL_TABLET | Freq: Four times a day (QID) | ORAL | 0 refills | Status: DC | PRN
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
## 2022-01-05 NOTE — ED Notes (Signed)
Medications dispensed for home use as noted below d/t pt has difficulty swallowing pills and needs to cut them up and put them in food. Incentive spirometry teaching completed, pt verbalizes understanding
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
## 2022-01-06 ENCOUNTER — Telehealth: Payer: Self-pay | Admitting: Family Medicine
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
## 2022-02-08 ENCOUNTER — Other Ambulatory Visit: Payer: Self-pay | Admitting: Adult Health

## 2022-02-08 DIAGNOSIS — F901 Attention-deficit hyperactivity disorder, predominantly hyperactive type: Secondary | ICD-10-CM

## 2022-02-08 DIAGNOSIS — F331 Major depressive disorder, recurrent, moderate: Secondary | ICD-10-CM

## 2022-02-08 DIAGNOSIS — F411 Generalized anxiety disorder: Secondary | ICD-10-CM

## 2022-02-18 ENCOUNTER — Ambulatory Visit: Payer: No Typology Code available for payment source | Admitting: Sports Medicine

## 2022-02-18 ENCOUNTER — Other Ambulatory Visit: Payer: Self-pay

## 2022-02-18 ENCOUNTER — Ambulatory Visit: Payer: No Typology Code available for payment source

## 2022-02-18 VITALS — BP 118/78 | HR 78 | Ht 65.0 in | Wt 128.0 lb

## 2022-02-18 DIAGNOSIS — R4189 Other symptoms and signs involving cognitive functions and awareness: Secondary | ICD-10-CM

## 2022-02-18 DIAGNOSIS — S060X0A Concussion without loss of consciousness, initial encounter: Secondary | ICD-10-CM | POA: Diagnosis not present

## 2022-02-18 DIAGNOSIS — R5383 Other fatigue: Secondary | ICD-10-CM | POA: Diagnosis not present
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
## 2022-02-18 NOTE — Patient Instructions (Addendum)
Good to see you ?Complete mental and physical rest for 48 hours after concussive event ?Recommend light aerobic activity while keeping symptoms less than 3/10 ?Stop mental or physical activities that cause symptoms to worsen greater than 3/10, and wait 24 hours before attempting them again ?Eliminate screen time as much as possible for first 48 hours after concussive event, then continue limited screen time (recommend less than 2 hours per day) ?Work note provided ?Recommend starting melatonin 5 mg nightly  ?1 week follow up  ?
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
## 2022-02-25 ENCOUNTER — Ambulatory Visit: Payer: No Typology Code available for payment source | Admitting: Sports Medicine

## 2022-02-26 ENCOUNTER — Telehealth: Payer: Self-pay | Admitting: Adult Health

## 2022-02-26 NOTE — Telephone Encounter (Signed)
Pt called at 11:32 am and said that she has a question about the latuda medicine. Please give her a call at [phone] ?

## 2022-02-26 NOTE — Telephone Encounter (Signed)
Called and spike with patient.
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
## 2022-02-26 NOTE — Telephone Encounter (Signed)
Pt has a question about lexapro,not latuda.She has not been on the lexapro in awhile and wants to restart it.She has not been seen since august 2022.Do you want her to make an appt to discuss?

## 2022-02-27 ENCOUNTER — Ambulatory Visit: Payer: No Typology Code available for payment source | Admitting: Sports Medicine

## 2022-02-27 ENCOUNTER — Other Ambulatory Visit: Payer: Self-pay

## 2022-02-27 VITALS — BP 110/80 | HR 77 | Ht 65.0 in | Wt 130.0 lb

## 2022-02-27 DIAGNOSIS — S060X0A Concussion without loss of consciousness, initial encounter: Secondary | ICD-10-CM

## 2022-02-27 DIAGNOSIS — R5383 Other fatigue: Secondary | ICD-10-CM | POA: Diagnosis not present

## 2022-02-27 DIAGNOSIS — R270 Ataxia, unspecified: Secondary | ICD-10-CM

## 2022-02-27 DIAGNOSIS — R4189 Other symptoms and signs involving cognitive functions and awareness: Secondary | ICD-10-CM | POA: Diagnosis not present

## 2022-02-27 NOTE — Patient Instructions (Addendum)
Good to see you ?Work note  ?Vestibular therapy referral  ?2 week follow up  ?

## 2022-03-01 IMAGING — CT CT CARDIAC CORONARY ARTERY CALCIUM SCORE
3 series · 14 of 20 positions shown, 16 images · non-contrast
Comparison: None.

CLINICAL DATA: 55-year-old Caucasian female with family history of
heart disease.

EXAM:
CT CARDIAC CORONARY ARTERY CALCIUM SCORE
TECHNIQUE: Non-contrast imaging through the heart was performed using
prospective ECG gating. Image post processing was performed on an
independent workstation, allowing for quantitative analysis of the
heart and coronary arteries. Note that this exam targets the heart
and the chest was not imaged in its entirety.

[Series 2: calcium scoring 2.00 qr36 bestdiast 68% hrt calciu · axial · 0.28mm/px · z∈[+1662,+1746]mm · 4 of 70 slices shown]
[im 14/70  vessel]
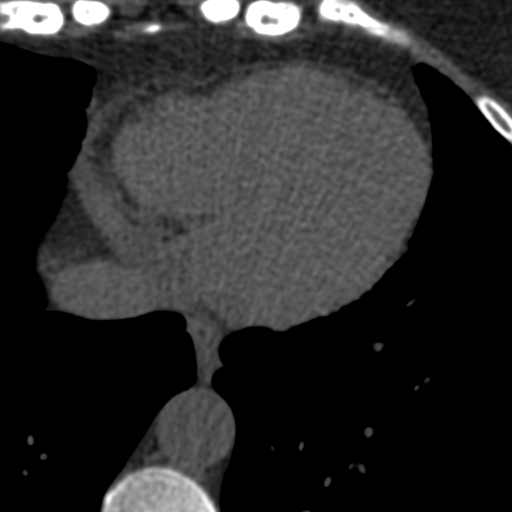
[im 28/70  vessel]
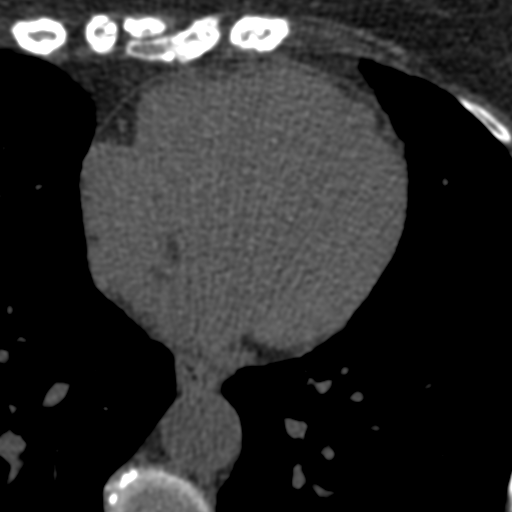
[im 42/70  vessel]
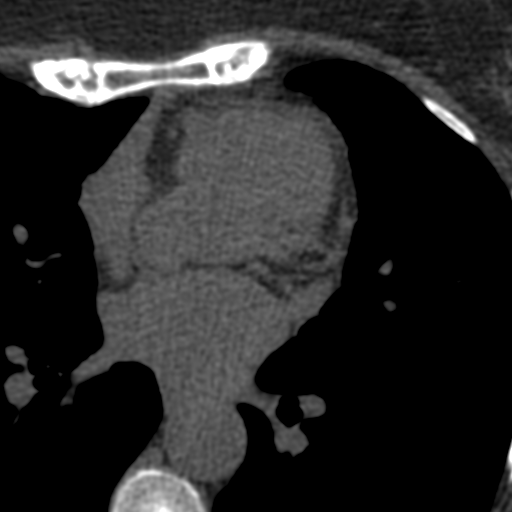
[im 56/70  vessel]
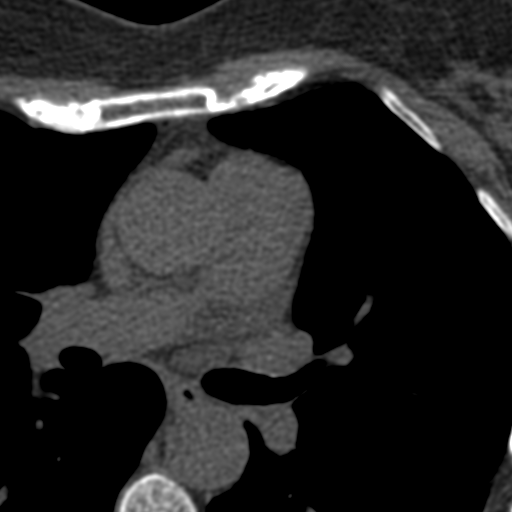

[Series 3: calcium scoring 2.00 br40 bestdiast 68% axial · axial · 0.53mm/px · z∈[+1658,+1750]mm · 5 of 70 slices shown, 7 images]
[im 12/70  vessel]
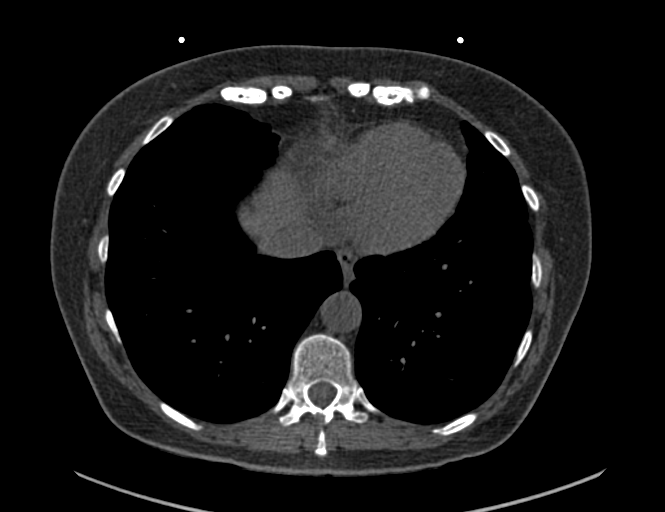
[im 12/70  lung]
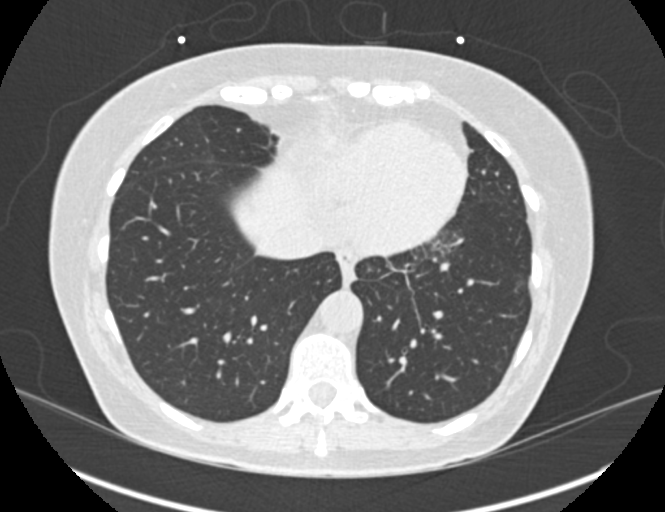
[im 24/70  vessel]
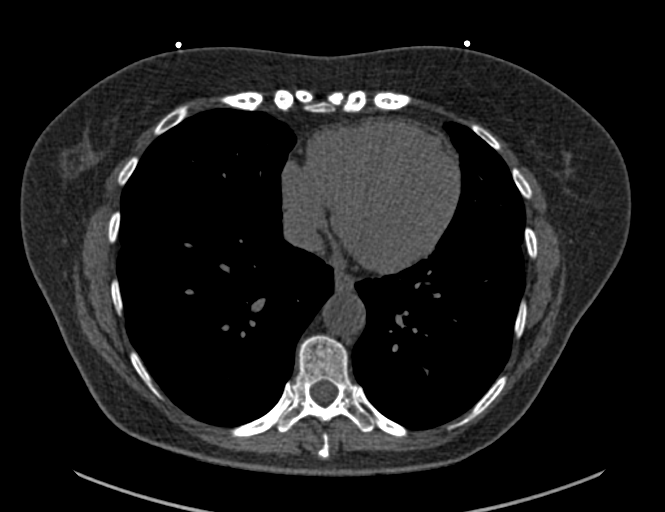
[im 35/70  vessel]
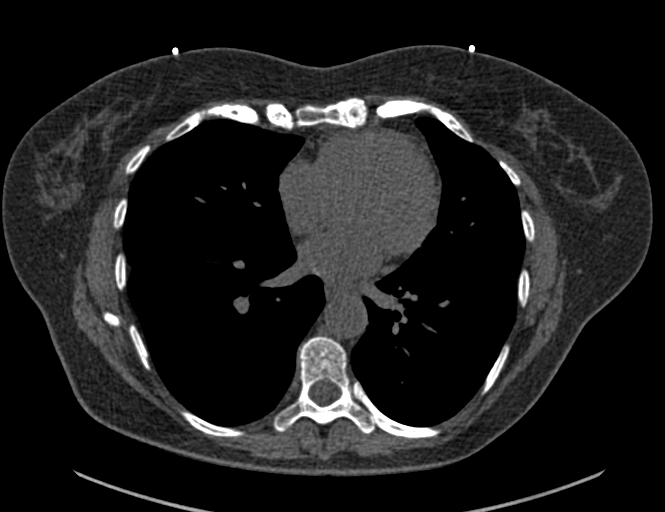
[im 47/70  vessel]
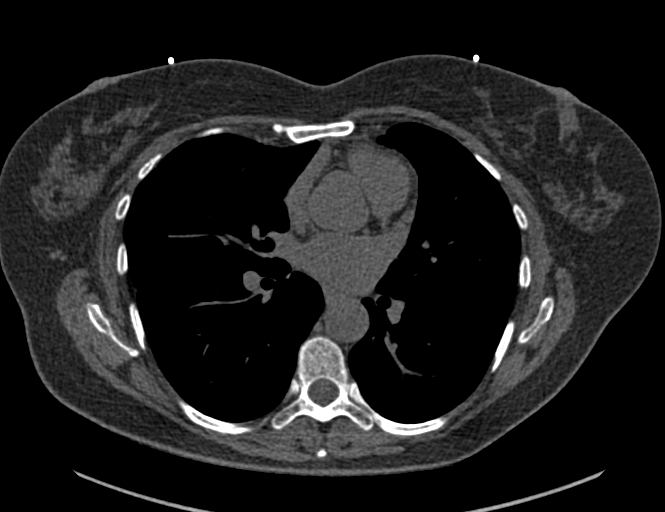
[im 58/70  vessel]
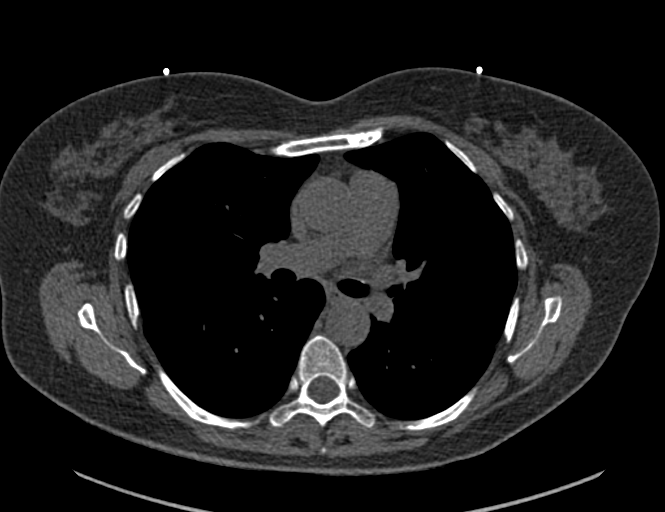
[im 58/70  lung]
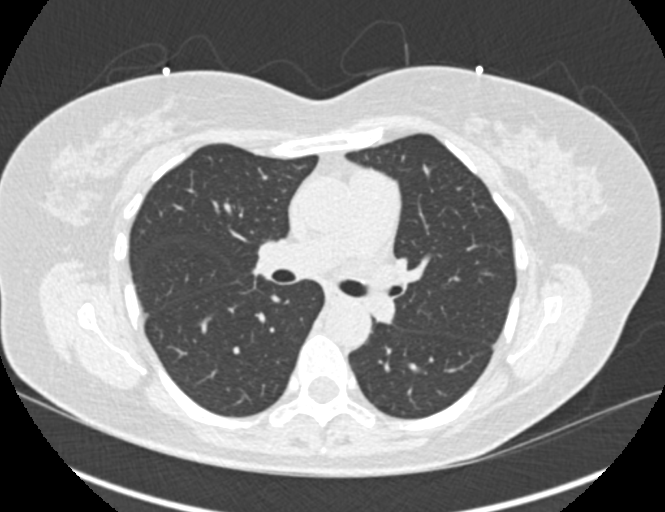

[Series 9: calcium scoring 2.00 br60 bestdiast 68% lungs · axial · 0.53mm/px · z∈[+1658,+1750]mm · 5 of 70 slices shown]
[im 12/70  vessel]
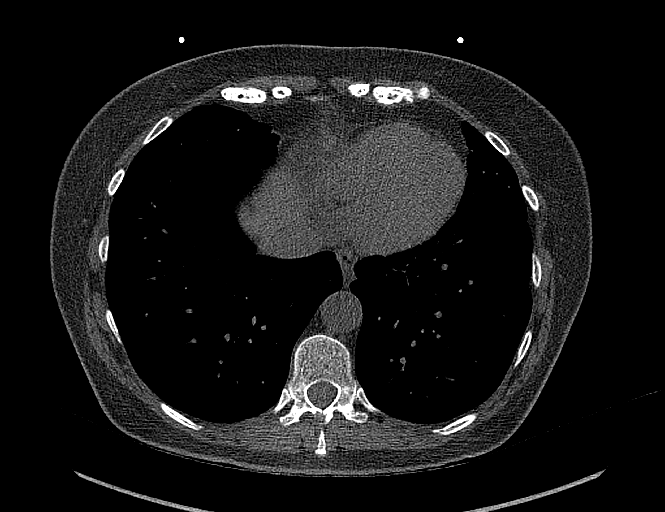
[im 24/70  vessel]
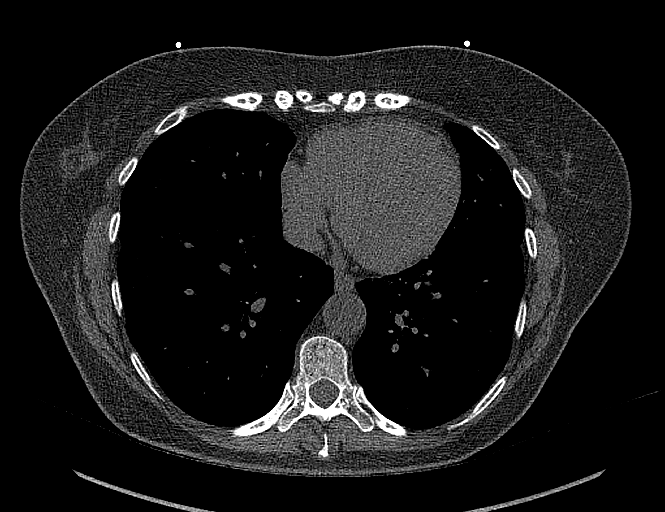
[im 35/70  vessel]
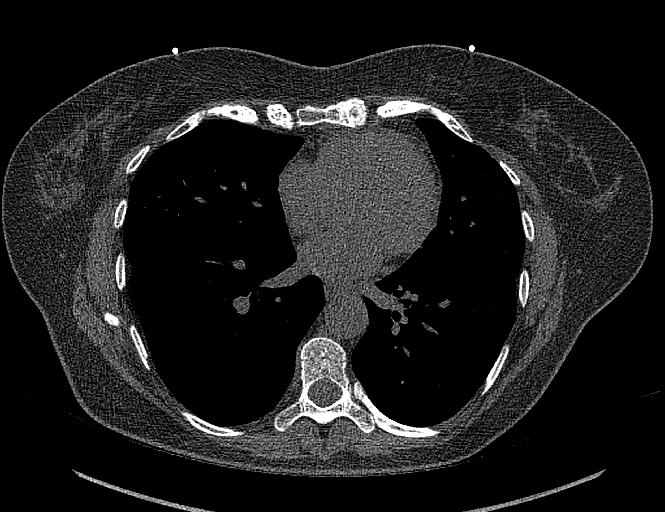
[im 47/70  vessel]
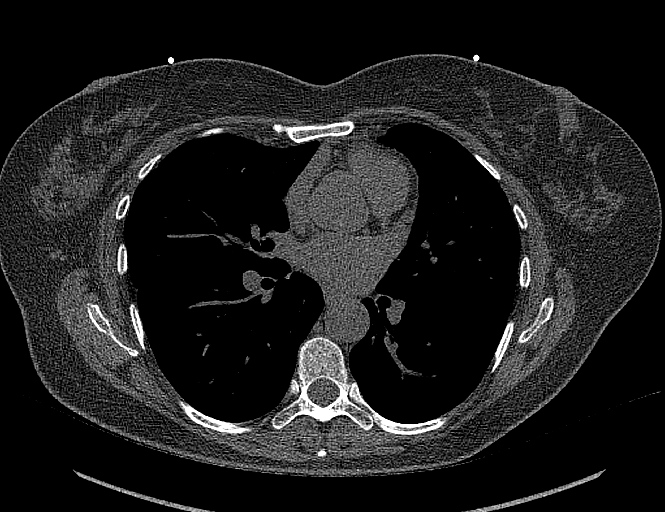
[im 58/70  vessel]
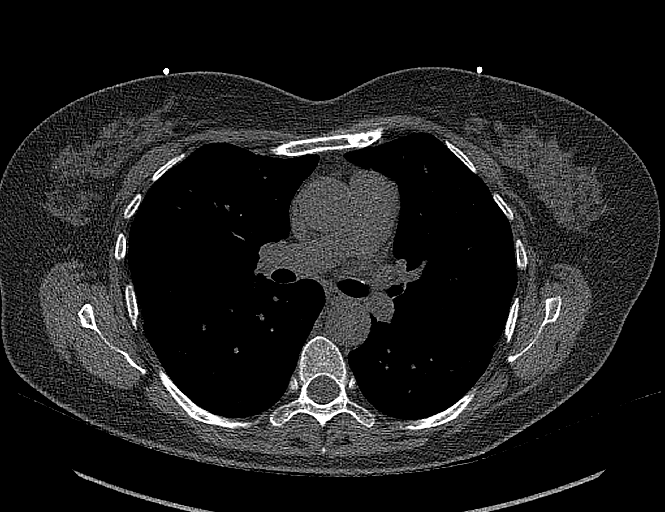

[14 of 20 positions shown; findings below may reference images not displayed]

FINDINGS: CORONARY CALCIUM SCORES:

Left Main: 0

LAD: 0

LCx: 0

RCA: 0

Total Agatston Score: 0

[HOSPITAL] percentile: 0

AORTA MEASUREMENTS:

Ascending Aorta: 29 mm

Descending Aorta: 24 mm

OTHER FINDINGS:

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
## 2022-03-13 ENCOUNTER — Ambulatory Visit (INDEPENDENT_AMBULATORY_CARE_PROVIDER_SITE_OTHER): Payer: No Typology Code available for payment source | Admitting: Sports Medicine

## 2022-03-13 VITALS — BP 118/78 | HR 91 | Ht 65.0 in | Wt 131.0 lb

## 2022-03-13 DIAGNOSIS — S060X0A Concussion without loss of consciousness, initial encounter: Secondary | ICD-10-CM

## 2022-03-13 DIAGNOSIS — R270 Ataxia, unspecified: Secondary | ICD-10-CM | POA: Diagnosis not present

## 2022-03-13 DIAGNOSIS — R5383 Other fatigue: Secondary | ICD-10-CM | POA: Diagnosis not present

## 2022-03-13 DIAGNOSIS — F339 Major depressive disorder, recurrent, unspecified: Secondary | ICD-10-CM

## 2022-03-13 DIAGNOSIS — R4189 Other symptoms and signs involving cognitive functions and awareness: Secondary | ICD-10-CM

## 2022-03-13 MED ORDER — TRAZODONE HCL 50 MG PO TABS: 50.0000 mg | ORAL_TABLET | Freq: Every day | ORAL | 0 refills | Status: DC

## 2022-03-13 NOTE — Patient Instructions (Addendum)
Good to see you  ?Start trazodone 50 mg nightly  ?Referral vestibular therapy  ?Work note out of work 2 weeks  ?Continue melatonin 5 mg nightly  ?2 week follow up  ? ?

## 2022-03-21 ENCOUNTER — Other Ambulatory Visit: Payer: Self-pay | Admitting: Adult Health

## 2022-03-21 DIAGNOSIS — F901 Attention-deficit hyperactivity disorder, predominantly hyperactive type: Secondary | ICD-10-CM

## 2022-03-27 ENCOUNTER — Ambulatory Visit: Payer: No Typology Code available for payment source | Admitting: Sports Medicine
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
## 2022-03-28 ENCOUNTER — Ambulatory Visit: Payer: No Typology Code available for payment source | Admitting: Sports Medicine

## 2022-03-28 VITALS — BP 110/80 | HR 78 | Ht 65.0 in | Wt 131.0 lb

## 2022-03-28 DIAGNOSIS — F339 Major depressive disorder, recurrent, unspecified: Secondary | ICD-10-CM

## 2022-03-28 DIAGNOSIS — R5383 Other fatigue: Secondary | ICD-10-CM

## 2022-03-28 DIAGNOSIS — R4189 Other symptoms and signs involving cognitive functions and awareness: Secondary | ICD-10-CM | POA: Diagnosis not present

## 2022-03-28 DIAGNOSIS — R270 Ataxia, unspecified: Secondary | ICD-10-CM | POA: Diagnosis not present

## 2022-03-28 DIAGNOSIS — S060X0A Concussion without loss of consciousness, initial encounter: Secondary | ICD-10-CM

## 2022-03-28 MED ORDER — TRAZODONE HCL 100 MG PO TABS: 100.0000 mg | ORAL_TABLET | Freq: Every day | ORAL | 0 refills | Status: DC

## 2022-03-28 NOTE — Patient Instructions (Addendum)
Good to see you  ?Increase trazodone 100 mg nightly  ?Can take 2 tablets 50 mg until med run out  ?Then start trazodone 100 mg tablets  ?Trazodone 100 mg 30 0' ?Work note provided ?Start a taper for lorazepam  ?Start 2 mg 1 tablet nighty for 3 days   ?then ativan 2 mg half tablet for 3 days  ?Then ativan 1/4th tablet nightly for 3 days  ?Then discontinue  ?If you experience symptoms during taper then increase medication to the previous step  ?2 week follow up  ? ?
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
## 2022-04-17 ENCOUNTER — Ambulatory Visit: Payer: No Typology Code available for payment source | Admitting: Sports Medicine

## 2022-04-17 VITALS — BP 120/80 | HR 64 | Ht 65.0 in | Wt 131.0 lb

## 2022-04-17 DIAGNOSIS — R5383 Other fatigue: Secondary | ICD-10-CM | POA: Diagnosis not present

## 2022-04-17 DIAGNOSIS — R4189 Other symptoms and signs involving cognitive functions and awareness: Secondary | ICD-10-CM

## 2022-04-17 DIAGNOSIS — S060X0A Concussion without loss of consciousness, initial encounter: Secondary | ICD-10-CM | POA: Diagnosis not present

## 2022-04-17 DIAGNOSIS — R270 Ataxia, unspecified: Secondary | ICD-10-CM | POA: Diagnosis not present

## 2022-04-17 MED ORDER — AMANTADINE HCL 100 MG PO CAPS: 100.0000 mg | ORAL_CAPSULE | Freq: Two times a day (BID) | ORAL | 0 refills | Status: AC

## 2022-04-17 NOTE — Patient Instructions (Addendum)
Good to see you  ?2 week work note out of work  ?Start amantadine 100 mg 2x a day  ?Continue vestibular therapy  ?Increase to trazodone 150 mg nightly if that is ineffective can increase to 200 mg nightly  ?2 week follow up  ? ?

## 2022-04-22 ENCOUNTER — Telehealth: Payer: Self-pay | Admitting: Adult Health

## 2022-04-22 NOTE — Telephone Encounter (Signed)
Take 1/2 tab daily for 7 days, then leave off.

## 2022-04-22 NOTE — Telephone Encounter (Signed)
See message from patient.  She said she has been taking 30-60 days this time and she doesn't feel like it is helping and she has gained weight. Please advise if she needs to wean. I asked if she wanted to try another medication and she did not.  I told her she needs to make appt to F/U, last seen in August with RTC in 6 months. She was not very receptive, but will have Adm call her.  ?

## 2022-04-22 NOTE — Telephone Encounter (Signed)
Patient called in stating that she would like to ween off Lexapro she just started. Would like to know how she should go about it. Pls rtc to discuss [phone] ?

## 2022-04-23 NOTE — Telephone Encounter (Signed)
LVM to RC 

## 2022-04-24 NOTE — Telephone Encounter (Signed)
Left another VM to RC.  
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
## 2022-04-25 ENCOUNTER — Ambulatory Visit: Payer: No Typology Code available for payment source | Admitting: Sports Medicine

## 2022-04-25 VITALS — BP 108/80 | HR 59 | Ht 65.0 in | Wt 137.0 lb

## 2022-04-25 DIAGNOSIS — S060X0A Concussion without loss of consciousness, initial encounter: Secondary | ICD-10-CM

## 2022-04-25 DIAGNOSIS — R5383 Other fatigue: Secondary | ICD-10-CM | POA: Diagnosis not present

## 2022-04-25 DIAGNOSIS — R270 Ataxia, unspecified: Secondary | ICD-10-CM

## 2022-04-25 DIAGNOSIS — R4189 Other symptoms and signs involving cognitive functions and awareness: Secondary | ICD-10-CM | POA: Diagnosis not present

## 2022-04-25 MED ORDER — TRAZODONE HCL 100 MG PO TABS: 100.0000 mg | ORAL_TABLET | Freq: Every day | ORAL | 0 refills | Status: DC

## 2022-04-25 NOTE — Patient Instructions (Addendum)
Good to see you  ?Work note provided  ?Continue vestibular therapy ?Increase trazodone to 150 mg nightly if 150 mg is ineffective can increase to 200 mg  ?Refill trazodone  ?3 week follow up  ? ? ?

## 2022-04-25 NOTE — Telephone Encounter (Signed)
Patient called back and recommendations were given.  ?

## 2022-04-30 ENCOUNTER — Ambulatory Visit: Payer: No Typology Code available for payment source | Admitting: Sports Medicine

## 2022-05-05 ENCOUNTER — Telehealth: Payer: Self-pay | Admitting: *Deleted

## 2022-05-05 NOTE — Telephone Encounter (Signed)
Pt called requesting a call back to discuss work note.

## 2022-05-05 NOTE — Telephone Encounter (Signed)
Pt was given a new note with the return to work date being tomorrow
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
## 2022-05-16 ENCOUNTER — Ambulatory Visit: Payer: No Typology Code available for payment source | Admitting: Sports Medicine

## 2022-05-19 ENCOUNTER — Encounter: Payer: Self-pay | Admitting: *Deleted

## 2022-05-26 ENCOUNTER — Other Ambulatory Visit: Payer: Self-pay | Admitting: Adult Health

## 2022-05-26 DIAGNOSIS — F901 Attention-deficit hyperactivity disorder, predominantly hyperactive type: Secondary | ICD-10-CM

## 2022-05-27 NOTE — Telephone Encounter (Signed)
Please schedule appt

## 2022-05-31 ENCOUNTER — Other Ambulatory Visit: Payer: Self-pay | Admitting: Adult Health

## 2022-05-31 DIAGNOSIS — F1020 Alcohol dependence, uncomplicated: Secondary | ICD-10-CM

## 2022-06-03 NOTE — Telephone Encounter (Signed)
Last seen 8/22 and had a no show. Please call to schedule an appt.
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
## 2022-06-06 ENCOUNTER — Other Ambulatory Visit: Payer: Self-pay

## 2022-06-06 DIAGNOSIS — F901 Attention-deficit hyperactivity disorder, predominantly hyperactive type: Secondary | ICD-10-CM

## 2022-06-06 MED ORDER — LISDEXAMFETAMINE DIMESYLATE 70 MG PO CAPS: 70.0000 mg | ORAL_CAPSULE | Freq: Every day | ORAL | 0 refills | Status: DC

## 2022-06-16 NOTE — Telephone Encounter (Signed)
Pt scheduled appt on 6/23 for 7/10.

## 2022-06-16 NOTE — Telephone Encounter (Signed)
Please refuse

## 2022-06-23 ENCOUNTER — Ambulatory Visit (INDEPENDENT_AMBULATORY_CARE_PROVIDER_SITE_OTHER): Payer: No Typology Code available for payment source | Admitting: Adult Health

## 2022-06-23 ENCOUNTER — Encounter: Payer: Self-pay | Admitting: Adult Health

## 2022-06-23 DIAGNOSIS — F901 Attention-deficit hyperactivity disorder, predominantly hyperactive type: Secondary | ICD-10-CM

## 2022-06-23 DIAGNOSIS — F411 Generalized anxiety disorder: Secondary | ICD-10-CM | POA: Diagnosis not present

## 2022-06-23 DIAGNOSIS — G4700 Insomnia, unspecified: Secondary | ICD-10-CM

## 2022-06-23 DIAGNOSIS — F1020 Alcohol dependence, uncomplicated: Secondary | ICD-10-CM | POA: Diagnosis not present

## 2022-06-23 DIAGNOSIS — F331 Major depressive disorder, recurrent, moderate: Secondary | ICD-10-CM | POA: Diagnosis not present

## 2022-06-23 MED ORDER — TRAZODONE HCL 100 MG PO TABS: 100.0000 mg | ORAL_TABLET | Freq: Every day | ORAL | 5 refills | Status: DC

## 2022-06-23 MED ORDER — LISDEXAMFETAMINE DIMESYLATE 70 MG PO CAPS: 70.0000 mg | ORAL_CAPSULE | Freq: Every day | ORAL | 0 refills | Status: DC

## 2022-06-23 MED ORDER — NALTREXONE HCL 50 MG PO TABS: 50.0000 mg | ORAL_TABLET | Freq: Every day | ORAL | 2 refills | Status: DC

## 2022-06-23 MED ORDER — BUPROPION HCL ER (XL) 300 MG PO TB24: 300.0000 mg | ORAL_TABLET | Freq: Every day | ORAL | 5 refills | Status: DC

## 2022-06-23 MED ORDER — LORAZEPAM 2 MG PO TABS: 2.0000 mg | ORAL_TABLET | Freq: Every evening | ORAL | 2 refills | Status: DC | PRN
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
## 2022-07-28 ENCOUNTER — Telehealth: Payer: Self-pay | Admitting: Adult Health

## 2022-07-28 NOTE — Telephone Encounter (Signed)
Pt will call around to find a pharmacy first

## 2022-07-28 NOTE — Telephone Encounter (Signed)
Patient lvm at 1:41 requesting refill. States that pharmacy is out of her Vyvanse and she would like to switch back to the Adderall XR '30mg'$ . Please rtc [phone]

## 2022-07-29 ENCOUNTER — Other Ambulatory Visit: Payer: Self-pay | Admitting: Adult Health

## 2022-07-29 DIAGNOSIS — F901 Attention-deficit hyperactivity disorder, predominantly hyperactive type: Secondary | ICD-10-CM

## 2022-07-29 MED ORDER — AMPHETAMINE-DEXTROAMPHET ER 30 MG PO CP24: 30.0000 mg | ORAL_CAPSULE | Freq: Every day | ORAL | 0 refills | Status: DC

## 2022-07-29 NOTE — Telephone Encounter (Signed)
Pt called around and could not find vyvanse.Adderall XR 30 mg is in stock if you agree to switch back

## 2022-07-29 NOTE — Telephone Encounter (Signed)
I spoke w pt and sent script.

## 2022-08-20 ENCOUNTER — Encounter: Payer: Self-pay | Admitting: Internal Medicine

## 2022-10-06 ENCOUNTER — Encounter: Payer: Self-pay | Admitting: Internal Medicine

## 2023-01-15 ENCOUNTER — Other Ambulatory Visit: Payer: Self-pay | Admitting: Adult Health

## 2023-01-15 DIAGNOSIS — F411 Generalized anxiety disorder: Secondary | ICD-10-CM

## 2023-01-15 DIAGNOSIS — F331 Major depressive disorder, recurrent, moderate: Secondary | ICD-10-CM

## 2023-01-16 ENCOUNTER — Other Ambulatory Visit: Payer: Self-pay | Admitting: Adult Health

## 2023-01-16 DIAGNOSIS — F411 Generalized anxiety disorder: Secondary | ICD-10-CM

## 2023-01-16 DIAGNOSIS — F901 Attention-deficit hyperactivity disorder, predominantly hyperactive type: Secondary | ICD-10-CM

## 2023-01-16 DIAGNOSIS — F331 Major depressive disorder, recurrent, moderate: Secondary | ICD-10-CM

## 2023-01-16 MED ORDER — LORAZEPAM 2 MG PO TABS: 2.0000 mg | ORAL_TABLET | Freq: Every evening | ORAL | 2 refills | Status: DC | PRN

## 2023-01-16 MED ORDER — LISDEXAMFETAMINE DIMESYLATE 70 MG PO CAPS: 70.0000 mg | ORAL_CAPSULE | Freq: Every day | ORAL | 0 refills | Status: DC

## 2023-01-16 NOTE — Telephone Encounter (Signed)
Please refuse

## 2023-02-11 ENCOUNTER — Other Ambulatory Visit: Payer: Self-pay | Admitting: Adult Health

## 2023-02-11 DIAGNOSIS — F901 Attention-deficit hyperactivity disorder, predominantly hyperactive type: Secondary | ICD-10-CM

## 2023-02-11 DIAGNOSIS — F1020 Alcohol dependence, uncomplicated: Secondary | ICD-10-CM

## 2023-02-12 NOTE — Telephone Encounter (Signed)
Please call to schedule an appt, past due

## 2023-02-13 NOTE — Telephone Encounter (Signed)
Phone dc.

## 2023-02-17 ENCOUNTER — Other Ambulatory Visit: Payer: Self-pay | Admitting: Adult Health

## 2023-02-17 DIAGNOSIS — F901 Attention-deficit hyperactivity disorder, predominantly hyperactive type: Secondary | ICD-10-CM

## 2023-02-17 DIAGNOSIS — F1020 Alcohol dependence, uncomplicated: Secondary | ICD-10-CM

## 2023-02-17 MED ORDER — LISDEXAMFETAMINE DIMESYLATE 70 MG PO CAPS: 70.0000 mg | ORAL_CAPSULE | Freq: Every day | ORAL | 0 refills | Status: DC

## 2023-02-17 MED ORDER — NALTREXONE HCL 50 MG PO TABS: 50.0000 mg | ORAL_TABLET | Freq: Every day | ORAL | 2 refills | Status: DC

## 2023-03-17 ENCOUNTER — Other Ambulatory Visit: Payer: Self-pay | Admitting: Adult Health

## 2023-03-17 ENCOUNTER — Telehealth: Payer: Self-pay | Admitting: Adult Health

## 2023-03-17 DIAGNOSIS — F901 Attention-deficit hyperactivity disorder, predominantly hyperactive type: Secondary | ICD-10-CM

## 2023-03-17 MED ORDER — LISDEXAMFETAMINE DIMESYLATE 70 MG PO CAPS: 70.0000 mg | ORAL_CAPSULE | Freq: Every day | ORAL | 0 refills | Status: DC

## 2023-03-17 NOTE — Telephone Encounter (Signed)
Script sent  

## 2023-03-17 NOTE — Progress Notes (Signed)
Refill request

## 2023-03-17 NOTE — Telephone Encounter (Signed)
Pt made appt for April 16. She is requesting a RF on Vyvanse 70mg   in stock at [facility]: [place], [place]

## 2023-03-31 ENCOUNTER — Encounter: Payer: Self-pay | Admitting: Adult Health

## 2023-03-31 ENCOUNTER — Telehealth (INDEPENDENT_AMBULATORY_CARE_PROVIDER_SITE_OTHER): Payer: BLUE CROSS/BLUE SHIELD | Admitting: Adult Health

## 2023-03-31 DIAGNOSIS — F331 Major depressive disorder, recurrent, moderate: Secondary | ICD-10-CM | POA: Diagnosis not present

## 2023-03-31 DIAGNOSIS — F901 Attention-deficit hyperactivity disorder, predominantly hyperactive type: Secondary | ICD-10-CM

## 2023-03-31 DIAGNOSIS — F411 Generalized anxiety disorder: Secondary | ICD-10-CM

## 2023-03-31 DIAGNOSIS — F1020 Alcohol dependence, uncomplicated: Secondary | ICD-10-CM

## 2023-03-31 MED ORDER — LISDEXAMFETAMINE DIMESYLATE 70 MG PO CAPS
70.0000 mg | ORAL_CAPSULE | Freq: Every day | ORAL | 0 refills | Status: DC
Start: 2023-05-26 — End: 2023-11-24

## 2023-03-31 MED ORDER — BUPROPION HCL ER (XL) 300 MG PO TB24: 300.0000 mg | ORAL_TABLET | Freq: Every day | ORAL | 5 refills | Status: DC

## 2023-03-31 MED ORDER — LISDEXAMFETAMINE DIMESYLATE 70 MG PO CAPS: 70.0000 mg | ORAL_CAPSULE | Freq: Every day | ORAL | 0 refills | Status: DC

## 2023-03-31 MED ORDER — LORAZEPAM 2 MG PO TABS
2.0000 mg | ORAL_TABLET | Freq: Every evening | ORAL | 2 refills | Status: DC | PRN
Start: 2023-03-31 — End: 2023-08-13

## 2023-03-31 MED ORDER — NALTREXONE HCL 50 MG PO TABS
50.0000 mg | ORAL_TABLET | Freq: Every day | ORAL | 2 refills | Status: DC
Start: 2023-03-31 — End: 2023-11-24

## 2023-03-31 NOTE — Progress Notes (Addendum)
Maureen White [id number] 1966/01/12 57 y.o.  Virtual Visit via Video Note   I connected with pt @ on 03/31/23 at 02:00 PM EDT by a video enabled telemedicine application and verified that I am speaking with the correct person using two identifiers.   I discussed the limitations of evaluation and management by telemedicine and the availability of in person appointments. The patient expressed understanding and agreed to proceed.   I discussed the assessment and treatment plan with the patient. The patient was provided an opportunity to ask questions and all were answered. The patient agreed with the plan and demonstrated an understanding of the instructions.   The patient was advised to call back or seek an in-person evaluation if the symptoms worsen or if the condition fails to improve as anticipated.   I provided 25 minutes of non-face-to-face time during this encounter.  The patient was located at home.  The provider was located at [facility].     Dorothyann Gibbs, NP    Subjective:   Patient ID:  Maureen White is a 57 y.o. (DOB 20-Jun-1966) female.  Chief Complaint: No chief complaint on file.   HPI Maureen White presents to the office today for follow-up of MDD, GAD, insomnia, and ADHD.  Describes mood today as "ok". Pleasant. Tearful at times. Mood symptoms - reports "some" depression - "trying to fight through it". Reports low level anxiety. Denies irritability. Reports some worry, rumination and over thinking. Mood is consistent. Stating "I'm think I'm doing better". Reporting decreased situational stressors. Feels like medications are helpful. Seeing therapist regularly. Stable interest and motivation. Taking medications as prescribed. Energy levels stable. Active, has a regular exercise routine.   Enjoys some usual interests and activities. Single. Lives alone with dog. Brothers in [place] and [place]. Spending time with family. Appetite adequate. Weight gain  - 138 pounds. Sleeping better some nights than others. Averages 7 hours. Focus and concentration improved with Vyvanse. Completing tasks. Managing aspects of household. 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  Denies AH or VH. Denies self harm. Denies substance use.  Reports decreased alcohol use.  Previous medication trials: Wellbutrin, Adderall, Prozac, Zoloft,    PHQ2-9    Flowsheet Row Office Visit from 06/30/2017 in [facility]  PHQ-2 Total Score 0      Flowsheet Row ED from 01/05/2022 in [facility] at [facility]  C-SSRS RISK CATEGORY No Risk       Review of Systems:  Review of Systems  Musculoskeletal:  Negative for gait problem.  Neurological:  Negative for tremors.  Psychiatric/Behavioral:         Please refer to HPI    Medications: I have reviewed the patient's current medications.  Current Outpatient Medications  Medication Sig Dispense Refill   amantadine (SYMMETREL) 100 MG capsule Take 1 capsule (100 mg total) by mouth 2 (two) times daily. 60 capsule 0   amphetamine-dextroamphetamine (ADDERALL XR) 30 MG 24 hr capsule Take 1 capsule (30 mg total) by mouth daily. 30 capsule 0   buPROPion (WELLBUTRIN XL) 300 MG 24 hr tablet Take 1 tablet (300 mg total) by mouth daily. 30 tablet 5   escitalopram (LEXAPRO) 10 MG tablet Take 1 tablet (10 mg total) by mouth daily. 30 tablet 5   HYDROcodone-acetaminophen (NORCO) 5-325 MG tablet Take 1 tablet by mouth every 6 (six) hours as needed for severe pain. 12 tablet 0   ibuprofen (ADVIL) 800 MG tablet Take 1  tablet (800 mg total) by mouth every 6 (six) hours as needed for mild pain. 21 tablet 0   lisdexamfetamine (VYVANSE) 70 MG capsule Take 1 capsule (70 mg total) by mouth daily. 30 capsule 0   [START ON 04/28/2023] lisdexamfetamine (VYVANSE) 70 MG capsule Take 1 capsule (70 mg total) by mouth daily. 30 capsule 0   [START ON 05/26/2023] lisdexamfetamine (VYVANSE) 70 MG capsule Take 1  capsule (70 mg total) by mouth daily. 30 capsule 0   LORazepam (ATIVAN) 2 MG tablet Take 1 tablet (2 mg total) by mouth at bedtime as needed for anxiety. 30 tablet 2   naltrexone (DEPADE) 50 MG tablet Take 1 tablet (50 mg total) by mouth daily. 30 tablet 2   neomycin-polymyxin b-dexamethasone (MAXITROL) 3.5-10000-0.1 SUSP SMARTSIG:In Eye(s)     traZODone (DESYREL) 100 MG tablet Take 1 tablet (100 mg total) by mouth at bedtime. 30 tablet 5   triamcinolone (KENALOG) 0.1 % SMARTSIG:1 Application Topical 2-3 Times Daily     valACYclovir (VALTREX) 1000 MG tablet Take 2 pills twice a day for one day at the onset of a blister 12 tablet 1   No current facility-administered medications for this visit.    Medication Side Effects: None  Allergies: No Known Allergies  No past medical history on file.  Past Medical History, Surgical history, Social history, and Family history were reviewed and updated as appropriate.   Please see review of systems for further details on the patient's review from today.   Objective:   Physical Exam:  There were no vitals taken for this visit.  Physical Exam Constitutional:      General: She is not in acute distress. Musculoskeletal:        General: No deformity.  Neurological:     Mental Status: She is alert and oriented to person, place, and time.     Coordination: Coordination normal.  Psychiatric:        Attention and Perception: Attention and perception normal. She does not perceive auditory or visual hallucinations.        Mood and Affect: Mood normal. Mood is not anxious or depressed. Affect is not labile, blunt, angry or inappropriate.        Speech: Speech normal.        Behavior: Behavior normal.        Thought Content: Thought content normal. Thought content is not paranoid or delusional. Thought content does not include homicidal or suicidal ideation. Thought content does not include homicidal or suicidal plan.        Cognition and Memory:  Cognition and memory normal.        Judgment: Judgment normal.     Comments: Insight intact     Lab Review:  No results found for: "NA", "K", "CL", "CO2", "GLUCOSE", "BUN", "CREATININE", "CALCIUM", "PROT", "ALBUMIN", "AST", "ALT", "ALKPHOS", "BILITOT", "GFRNONAA", "GFRAA"  No results found for: "WBC", "RBC", "HGB", "HCT", "PLT", "MCV", "MCH", "MCHC", "RDW", "LYMPHSABS", "MONOABS", "EOSABS", "BASOSABS"  No results found for: "POCLITH", "LITHIUM"   No results found for: "PHENYTOIN", "PHENOBARB", "VALPROATE", "CBMZ"   .res Assessment: Plan:    Plan:  PDMP reviewed  Vyvanse  daily  Lorazepam  at bedtime  Naltrexone  daily  Wellbutrin XL  daily. Denies seizure history.  DC Trazadone  at bedtime - vivid dreams  RTC 6 months  Discussed potential benefits, risk, and side effects of benzodiazepines to include potential risk of tolerance and dependence, as well as possible drowsiness.  Advised patient not to drive  if experiencing drowsiness and to take lowest possible effective dose to minimize risk of dependence and tolerance.  Discussed potential benefits, risks, and side effects of stimulants with patient to include increased heart rate, palpitations, insomnia, increased anxiety, increased irritability, or decreased appetite.  Instructed patient to contact office if experiencing any significant tolerability issues. Diagnoses and all orders for this visit:  ADHD (attention deficit hyperactivity disorder), predominantly hyperactive impulsive type -     lisdexamfetamine (VYVANSE) 70 MG capsule; Take 1 capsule (70 mg total) by mouth daily. -     lisdexamfetamine (VYVANSE) 70 MG capsule; Take 1 capsule (70 mg total) by mouth daily. -     lisdexamfetamine (VYVANSE) 70 MG capsule; Take 1 capsule (70 mg total) by mouth daily.  Major depressive disorder, recurrent episode, moderate -     buPROPion (WELLBUTRIN XL) 300 MG 24 hr tablet; Take 1 tablet (300 mg total) by  mouth daily. -     LORazepam (ATIVAN) 2 MG tablet; Take 1 tablet (2 mg total) by mouth at bedtime as needed for anxiety.  Generalized anxiety disorder -     LORazepam (ATIVAN) 2 MG tablet; Take 1 tablet (2 mg total) by mouth at bedtime as needed for anxiety.  Uncomplicated alcohol dependence -     naltrexone (DEPADE) 50 MG tablet; Take 1 tablet (50 mg total) by mouth daily.     Please see After Visit Summary for patient specific instructions.  No future appointments.   No orders of the defined types were placed in this encounter.   -------------------------------

## 2023-04-09 NOTE — Addendum Note (Signed)
Addended by: Dorothyann Gibbs on: 04/09/2023 08:10 AM   Modules accepted: Level of Service

## 2023-06-26 ENCOUNTER — Other Ambulatory Visit: Payer: Self-pay | Admitting: Obstetrics and Gynecology

## 2023-06-26 DIAGNOSIS — K7689 Other specified diseases of liver: Secondary | ICD-10-CM

## 2023-07-07 ENCOUNTER — Ambulatory Visit
Admission: RE | Admit: 2023-07-07 | Discharge: 2023-07-07 | Disposition: A | Discharge status: Home / Self Care | Payer: Medicaid Other | Source: Ambulatory Visit | Attending: Obstetrics and Gynecology | Admitting: Obstetrics and Gynecology

## 2023-07-07 DIAGNOSIS — K7689 Other specified diseases of liver: Secondary | ICD-10-CM

## 2023-08-03 ENCOUNTER — Telehealth: Payer: Self-pay | Admitting: Adult Health

## 2023-08-03 NOTE — Telephone Encounter (Signed)
PA request also sent for Buproprion XL 150mg 

## 2023-08-03 NOTE — Telephone Encounter (Signed)
Pharmacy sent PA Request for Vyvanse 70mg  , see CMM

## 2023-08-04 NOTE — Telephone Encounter (Signed)
Pt not taking Bupropion XL 150 mg

## 2023-08-13 ENCOUNTER — Other Ambulatory Visit: Payer: Self-pay | Admitting: Adult Health

## 2023-08-13 DIAGNOSIS — F331 Major depressive disorder, recurrent, moderate: Secondary | ICD-10-CM

## 2023-08-13 DIAGNOSIS — F411 Generalized anxiety disorder: Secondary | ICD-10-CM

## 2023-08-13 DIAGNOSIS — F901 Attention-deficit hyperactivity disorder, predominantly hyperactive type: Secondary | ICD-10-CM

## 2023-08-13 NOTE — Telephone Encounter (Signed)
Pt called [facility] @ 9:02a to request refill of Lorazepam and Vyvanse.  She requested they be sent before 1pm if at all possible.  No upcoming appt scheduled.

## 2023-11-17 ENCOUNTER — Other Ambulatory Visit: Payer: Self-pay | Admitting: Adult Health

## 2023-11-17 DIAGNOSIS — F331 Major depressive disorder, recurrent, moderate: Secondary | ICD-10-CM

## 2023-11-19 ENCOUNTER — Telehealth: Payer: Self-pay | Admitting: Adult Health

## 2023-11-19 ENCOUNTER — Other Ambulatory Visit: Payer: Self-pay

## 2023-11-19 DIAGNOSIS — F331 Major depressive disorder, recurrent, moderate: Secondary | ICD-10-CM

## 2023-11-19 DIAGNOSIS — F411 Generalized anxiety disorder: Secondary | ICD-10-CM

## 2023-11-19 MED ORDER — LORAZEPAM 2 MG PO TABS
2.0000 mg | ORAL_TABLET | Freq: Every evening | ORAL | 0 refills | Status: DC | PRN
Start: 2023-11-19 — End: 2023-11-24

## 2023-11-19 NOTE — Telephone Encounter (Signed)
Pended Rx of lorazepam to [facility].

## 2023-11-19 NOTE — Telephone Encounter (Signed)
Maureen White called and made appt for 12/10 and requested the refill of this med.  She is out

## 2023-11-24 ENCOUNTER — Encounter: Payer: Self-pay | Admitting: Adult Health

## 2023-11-24 ENCOUNTER — Telehealth (INDEPENDENT_AMBULATORY_CARE_PROVIDER_SITE_OTHER): Payer: 59 | Admitting: Adult Health

## 2023-11-24 DIAGNOSIS — F331 Major depressive disorder, recurrent, moderate: Secondary | ICD-10-CM | POA: Diagnosis not present

## 2023-11-24 DIAGNOSIS — F901 Attention-deficit hyperactivity disorder, predominantly hyperactive type: Secondary | ICD-10-CM

## 2023-11-24 DIAGNOSIS — F411 Generalized anxiety disorder: Secondary | ICD-10-CM | POA: Diagnosis not present

## 2023-11-24 DIAGNOSIS — F1020 Alcohol dependence, uncomplicated: Secondary | ICD-10-CM

## 2023-11-24 DIAGNOSIS — G4700 Insomnia, unspecified: Secondary | ICD-10-CM | POA: Diagnosis not present

## 2023-11-24 DIAGNOSIS — F909 Attention-deficit hyperactivity disorder, unspecified type: Secondary | ICD-10-CM

## 2023-11-24 MED ORDER — BUPROPION HCL ER (XL) 300 MG PO TB24: 300.0000 mg | ORAL_TABLET | Freq: Every day | ORAL | 2 refills | Status: DC

## 2023-11-24 MED ORDER — LISDEXAMFETAMINE DIMESYLATE 60 MG PO CAPS
60.0000 mg | ORAL_CAPSULE | Freq: Every day | ORAL | 0 refills | Status: DC
Start: 2023-11-24 — End: 2024-07-08

## 2023-11-24 MED ORDER — LISDEXAMFETAMINE DIMESYLATE 60 MG PO CAPS: 60.0000 mg | ORAL_CAPSULE | Freq: Every day | ORAL | 0 refills | Status: DC

## 2023-11-24 MED ORDER — NALTREXONE HCL 50 MG PO TABS: 50.0000 mg | ORAL_TABLET | Freq: Every day | ORAL | 2 refills | Status: AC

## 2023-11-24 MED ORDER — LORAZEPAM 2 MG PO TABS: 2.0000 mg | ORAL_TABLET | Freq: Every evening | ORAL | 2 refills | Status: DC | PRN

## 2023-11-24 NOTE — Progress Notes (Signed)
Maureen White [id number] Apr 30, 1966 57 y.o.  Virtual Visit via Telephone Note  I connected with pt on 11/24/23 at  9:40 AM EST by telephone and verified that I am speaking with the correct person using two identifiers.   I discussed the limitations, risks, security and privacy concerns of performing an evaluation and management service by telephone and the availability of in person appointments. I also discussed with the patient that there may be a patient responsible charge related to this service. The patient expressed understanding and agreed to proceed.   I discussed the assessment and treatment plan with the patient. The patient was provided an opportunity to ask questions and all were answered. The patient agreed with the plan and demonstrated an understanding of the instructions.   The patient was advised to call back or seek an in-person evaluation if the symptoms worsen or if the condition fails to improve as anticipated.  I provided 25 minutes of non-face-to-face time during this encounter.  The patient was located at home.  The provider was located at [facility].   Dorothyann Gibbs, NP   Subjective:   Patient ID:  Maureen White is a 57 y.o. (DOB 1966/04/16) female.  Chief Complaint: No chief complaint on file.   HPI Maureen White presents for follow-up of MDD, GAD, insomnia, and ADHD.  Describes mood today as "ok". Pleasant. Tearful at times. Mood symptoms - reports "some" depression.   Reports a "constant" anxiety. Denies irritability. Reports recent panic attack. Denies worry, rumination and over thinking. Mood is consistent - "overall". Stating "I feel like I'm doing ok". Reporting decreased situational stressors. Feels like medications are helpful. Seeing therapist regularly. Stable interest and motivation. Taking medications as prescribed. Energy levels stable. Active, has a regular exercise routine.   Enjoys some usual interests and activities.  Single. Lives alone with dog. Brothers in [place] and [place]. Spending time with family. Appetite adequate. Weight gain - 138 to 144 pounds. Sleeping better some nights than others. Averages 7 hours. Focus and concentration improved with Vyvanse - would like to decrease dose. Completing tasks. Managing aspects of household. 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  Denies AH or VH. Denies self harm. Denies substance use.  Reports decreased alcohol use.  Previous medication trials: Wellbutrin, Adderall, Prozac, Zoloft,    Review of Systems:  Review of Systems  Musculoskeletal:  Negative for gait problem.  Neurological:  Negative for tremors.  Psychiatric/Behavioral:         Please refer to HPI    Medications: I have reviewed the patient's current medications.  Current Outpatient Medications  Medication Sig Dispense Refill   amantadine (SYMMETREL) 100 MG capsule Take 1 capsule (100 mg total) by mouth 2 (two) times daily. 60 capsule 0   amphetamine-dextroamphetamine (ADDERALL XR) 30 MG 24 hr capsule Take 1 capsule (30 mg total) by mouth daily. 30 capsule 0   buPROPion (WELLBUTRIN XL) 300 MG 24 hr tablet TAKE 1 TABLET BY MOUTH ONCE DAILY 30 tablet 0   escitalopram (LEXAPRO) 10 MG tablet Take 1 tablet (10 mg total) by mouth daily. 30 tablet 5   HYDROcodone-acetaminophen (NORCO) 5-325 MG tablet Take 1 tablet by mouth every 6 (six) hours as needed for severe pain. 12 tablet 0   ibuprofen (ADVIL) 800 MG tablet Take 1 tablet (800 mg total) by mouth every 6 (six) hours as needed for mild pain. 21 tablet 0   lisdexamfetamine (VYVANSE) 70 MG capsule Take 1 capsule (70  mg total) by mouth daily. 30 capsule 0   lisdexamfetamine (VYVANSE) 70 MG capsule Take 1 capsule (70 mg total) by mouth daily. 30 capsule 0   LORazepam (ATIVAN) 2 MG tablet Take 1 tablet (2 mg total) by mouth at bedtime as needed for anxiety. 30 tablet 0   naltrexone (DEPADE) 50 MG tablet Take 1 tablet (50 mg total) by mouth  daily. 30 tablet 2   neomycin-polymyxin b-dexamethasone (MAXITROL) 3.5-10000-0.1 SUSP SMARTSIG:In Eye(s)     traZODone (DESYREL) 100 MG tablet Take 1 tablet (100 mg total) by mouth at bedtime. 30 tablet 5   triamcinolone (KENALOG) 0.1 % SMARTSIG:1 Application Topical 2-3 Times Daily     valACYclovir (VALTREX) 1000 MG tablet Take 2 pills twice a day for one day at the onset of a blister 12 tablet 1   VYVANSE 70 MG capsule Take 1 capsule (70 mg total) by mouth daily. 30 capsule 0   No current facility-administered medications for this visit.    Medication Side Effects: None  Allergies: No Known Allergies  No past medical history on file.  Family History  Problem Relation Age of Onset   Breast cancer Maternal Aunt        late 74's    Social History   Socioeconomic History   Marital status: Single    Spouse name: Not on file   Number of children: Not on file   Years of education: Not on file   Highest education level: Not on file  Occupational History   Not on file  Tobacco Use   Smoking status: Never   Smokeless tobacco: Never  Vaping Use   Vaping status: Never Used  Substance and Sexual Activity   Alcohol use: Yes    Alcohol/week: 0.0 standard drinks of alcohol    Comment: weekends usually   Drug use: No   Sexual activity: Not on file  Other Topics Concern   Not on file  Social History Narrative   Not on file   Social Determinants of Health   Financial Resource Strain: Not on file  Food Insecurity: Not on file  Transportation Needs: Not on file  Physical Activity: Not on file  Stress: Not on file  Social Connections: Unknown (04/25/2022)   Received from [facility]   Social Network    Social Network: Not on file  Intimate Partner Violence: Unknown (03/17/2022)   Received from [facility], [facility]   HITS    Physically Hurt: Not on file    Insult or Talk Down To: Not on file    Threaten Physical Harm: Not on file    Scream or Curse:  Not on file    Past Medical History, Surgical history, Social history, and Family history were reviewed and updated as appropriate.   Please see review of systems for further details on the patient's review from today.   Objective:   Physical Exam:  There were no vitals taken for this visit.  Physical Exam Constitutional:      General: She is not in acute distress. Musculoskeletal:        General: No deformity.  Neurological:     Mental Status: She is alert and oriented to person, place, and time.     Coordination: Coordination normal.  Psychiatric:        Attention and Perception: Attention and perception normal. She does not perceive auditory or visual hallucinations.        Mood and Affect: Mood normal. Mood is  not anxious or depressed. Affect is not labile, blunt, angry or inappropriate.        Speech: Speech normal.        Behavior: Behavior normal.        Thought Content: Thought content normal. Thought content is not paranoid or delusional. Thought content does not include homicidal or suicidal ideation. Thought content does not include homicidal or suicidal plan.        Cognition and Memory: Cognition and memory normal.        Judgment: Judgment normal.     Comments: Insight intact     Lab Review:  No results found for: "NA", "K", "CL", "CO2", "GLUCOSE", "BUN", "CREATININE", "CALCIUM", "PROT", "ALBUMIN", "AST", "ALT", "ALKPHOS", "BILITOT", "GFRNONAA", "GFRAA"  No results found for: "WBC", "RBC", "HGB", "HCT", "PLT", "MCV", "MCH", "MCHC", "RDW", "LYMPHSABS", "MONOABS", "EOSABS", "BASOSABS"  No results found for: "POCLITH", "LITHIUM"   No results found for: "PHENYTOIN", "PHENOBARB", "VALPROATE", "CBMZ"   .res Assessment: Plan:    Plan:  PDMP reviewed  Decrease Vyvanse 70mg  to 60mg  daily  Lorazepam 2mg  at bedtime  Wellbutrin XL 300mg  daily. Denies seizure history. Naltrexone 50mg  daily   RTC 6 months  Discussed potential benefits, risk, and side effects of  benzodiazepines to include potential risk of tolerance and dependence, as well as possible drowsiness.  Advised patient not to drive if experiencing drowsiness and to take lowest possible effective dose to minimize risk of dependence and tolerance.  Discussed potential benefits, risks, and side effects of stimulants with patient to include increased heart rate, palpitations, insomnia, increased anxiety, increased irritability, or decreased appetite.  Instructed patient to contact office if experiencing any significant tolerability issues. There are no diagnoses linked to this encounter.  Please see After Visit Summary for patient specific instructions.  Future Appointments  Date Time Provider Department Center  11/24/2023  9:40 AM Kynnedi Zweig, Thereasa Solo, NP [facility] None    No orders of the defined types were placed in this encounter.     -------------------------------

## 2024-02-09 ENCOUNTER — Encounter: Payer: Self-pay | Admitting: Internal Medicine

## 2024-02-13 ENCOUNTER — Other Ambulatory Visit: Payer: Self-pay | Admitting: Adult Health

## 2024-02-13 DIAGNOSIS — F411 Generalized anxiety disorder: Secondary | ICD-10-CM

## 2024-02-13 DIAGNOSIS — F331 Major depressive disorder, recurrent, moderate: Secondary | ICD-10-CM

## 2024-02-19 ENCOUNTER — Other Ambulatory Visit: Payer: Self-pay | Admitting: Adult Health

## 2024-02-19 DIAGNOSIS — F331 Major depressive disorder, recurrent, moderate: Secondary | ICD-10-CM

## 2024-03-17 ENCOUNTER — Other Ambulatory Visit: Payer: Self-pay | Admitting: Adult Health

## 2024-03-17 DIAGNOSIS — F331 Major depressive disorder, recurrent, moderate: Secondary | ICD-10-CM

## 2024-03-17 DIAGNOSIS — F411 Generalized anxiety disorder: Secondary | ICD-10-CM

## 2024-03-18 ENCOUNTER — Other Ambulatory Visit: Payer: Self-pay | Admitting: Adult Health

## 2024-03-18 DIAGNOSIS — F901 Attention-deficit hyperactivity disorder, predominantly hyperactive type: Secondary | ICD-10-CM

## 2024-05-03 ENCOUNTER — Other Ambulatory Visit: Payer: Self-pay | Admitting: Adult Health

## 2024-05-03 DIAGNOSIS — F901 Attention-deficit hyperactivity disorder, predominantly hyperactive type: Secondary | ICD-10-CM

## 2024-05-18 ENCOUNTER — Other Ambulatory Visit: Payer: Self-pay | Admitting: Adult Health

## 2024-05-18 DIAGNOSIS — F411 Generalized anxiety disorder: Secondary | ICD-10-CM

## 2024-05-18 DIAGNOSIS — F331 Major depressive disorder, recurrent, moderate: Secondary | ICD-10-CM

## 2024-05-25 ENCOUNTER — Telehealth: Admitting: Adult Health

## 2024-05-25 ENCOUNTER — Encounter: Payer: Self-pay | Admitting: Adult Health

## 2024-05-25 DIAGNOSIS — F331 Major depressive disorder, recurrent, moderate: Secondary | ICD-10-CM

## 2024-05-25 DIAGNOSIS — F909 Attention-deficit hyperactivity disorder, unspecified type: Secondary | ICD-10-CM | POA: Diagnosis not present

## 2024-05-25 DIAGNOSIS — G4700 Insomnia, unspecified: Secondary | ICD-10-CM | POA: Diagnosis not present

## 2024-05-25 DIAGNOSIS — F411 Generalized anxiety disorder: Secondary | ICD-10-CM

## 2024-05-25 DIAGNOSIS — F901 Attention-deficit hyperactivity disorder, predominantly hyperactive type: Secondary | ICD-10-CM

## 2024-05-25 MED ORDER — LISDEXAMFETAMINE DIMESYLATE 50 MG PO CAPS: 50.0000 mg | ORAL_CAPSULE | Freq: Every day | ORAL | 0 refills | Status: DC

## 2024-05-25 MED ORDER — LORAZEPAM 2 MG PO TABS: 2.0000 mg | ORAL_TABLET | Freq: Every day | ORAL | 2 refills | Status: DC

## 2024-05-25 MED ORDER — BUPROPION HCL ER (XL) 300 MG PO TB24
300.0000 mg | ORAL_TABLET | Freq: Every day | ORAL | 5 refills | Status: DC
Start: 2024-05-25 — End: 2024-07-14

## 2024-05-25 NOTE — Progress Notes (Signed)
 Maureen White [id number] Jul 04, 1966 58 y.o.  Virtual Visit via Video Note  I connected with pt @ on 05/25/24 at 10:00 AM EDT by a video enabled telemedicine application and verified that I am speaking with the correct person using two identifiers.   I discussed the limitations of evaluation and management by telemedicine and the availability of in person appointments. The patient expressed understanding and agreed to proceed.  I discussed the assessment and treatment plan with the patient. The patient was provided an opportunity to ask questions and all were answered. The patient agreed with the plan and demonstrated an understanding of the instructions.   The patient was advised to call back or seek an in-person evaluation if the symptoms worsen or if the condition fails to improve as anticipated.  I provided 25 minutes of non-face-to-face time during this encounter.  The patient was located at home.  The provider was located at [facility].   Reagan Camera, NP   Subjective:   Patient ID:  Maureen White is a 58 y.o. (DOB Oct 10, 1966) female.  Chief Complaint: No chief complaint on file.   HPI Ronda Rajkumar presents for follow-up of MDD, GAD, insomnia, and ADHD.  Describes mood today as ok. Pleasant. Tearful at times. Mood symptoms - reports some depression, anxiety and irritability. Reports stable interest and motivation. Reporting some situational stressors. Denies recent panic attacks. Denies worry, rumination and over thinking. Reports mood is stable. Stating I feel like I'm doing ok. Feels like medications are helpful. Taking medications as prescribed. Energy levels stable. Active, has a regular exercise routine.   Enjoys some usual interests and activities. Single. Lives alone with dog. Brothers in [place] and [place] . Spending time with family. Appetite adequate. Weight loss - 125 to 144 pounds. Sleeping better some nights than others. Averages 7  hours. Focus and concentration improved with Vyvanse . Completing tasks. Managing aspects of household. 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  Denies AH or VH. Denies self harm. Denies substance use.  Reports alcohol use.  Previous medication trials: Wellbutrin , Adderall, Prozac, Zoloft,   Review of Systems:  Review of Systems  Musculoskeletal:  Negative for gait problem.  Neurological:  Negative for tremors.  Psychiatric/Behavioral:         Please refer to HPI    Medications: I have reviewed the patient's current medications.  Current Outpatient Medications  Medication Sig Dispense Refill   amantadine  (SYMMETREL ) 100 MG capsule Take 1 capsule (100 mg total) by mouth 2 (two) times daily. 60 capsule 0   buPROPion  (WELLBUTRIN  XL) 300 MG 24 hr tablet TAKE 1 TABLET BY MOUTH DAILY Needs office visit 30 tablet 1   ibuprofen  (ADVIL ) 800 MG tablet Take 1 tablet (800 mg total) by mouth every 6 (six) hours as needed for mild pain. 21 tablet 0   lisdexamfetamine (VYVANSE ) 60 MG capsule Take 1 capsule (60 mg total) by mouth daily. 30 capsule 0   lisdexamfetamine (VYVANSE ) 60 MG capsule TAKE 1 CAPSULE BY MOUTH DAILY 30 capsule 0   LORazepam  (ATIVAN ) 2 MG tablet TAKE 1 TABLET BY MOUTH AT BEDTIME AS NEEDED FOR ANXIETY 30 tablet 2   naltrexone  (DEPADE) 50 MG tablet Take 1 tablet (50 mg total) by mouth daily. 30 tablet 2   neomycin-polymyxin b-dexamethasone (MAXITROL) 3.5-10000-0.1 SUSP SMARTSIG:In Eye(s)     triamcinolone (KENALOG) 0.1 % SMARTSIG:1 Application Topical 2-3 Times Daily     valACYclovir  (VALTREX ) 1000 MG tablet Take 2 pills twice a day  for one day at the onset of a blister 12 tablet 1   No current facility-administered medications for this visit.    Medication Side Effects: None  Allergies: No Known Allergies  No past medical history on file.  Family History  Problem Relation Age of Onset   Breast cancer Maternal Aunt        late 27's    Social History   Socioeconomic  History   Marital status: Single    Spouse name: Not on file   Number of children: Not on file   Years of education: Not on file   Highest education level: Not on file  Occupational History   Not on file  Tobacco Use   Smoking status: Never   Smokeless tobacco: Never  Vaping Use   Vaping status: Never Used  Substance and Sexual Activity   Alcohol use: Yes    Alcohol/week: 0.0 standard drinks of alcohol    Comment: weekends usually   Drug use: No   Sexual activity: Not on file  Other Topics Concern   Not on file  Social History Narrative   Not on file   Social Drivers of Health   Financial Resource Strain: Not on file  Food Insecurity: Not on file  Transportation Needs: Not on file  Physical Activity: Not on file  Stress: Not on file  Social Connections: Unknown (04/25/2022)   Received from [facility], [facility]   Social Network    Social Network: Not on file  Intimate Partner Violence: Unknown (03/17/2022)   Received from [facility], [facility]   HITS    Physically Hurt: Not on file    Insult or Talk Down To: Not on file    Threaten Physical Harm: Not on file    Scream or Curse: Not on file    Past Medical History, Surgical history, Social history, and Family history were reviewed and updated as appropriate.   Please see review of systems for further details on the patient's review from today.   Objective:   Physical Exam:  There were no vitals taken for this visit.  Physical Exam Constitutional:      General: She is not in acute distress. Musculoskeletal:        General: No deformity.  Neurological:     Mental Status: She is alert and oriented to person, place, and time.     Coordination: Coordination normal.  Psychiatric:        Attention and Perception: Attention and perception normal. She does not perceive auditory or visual hallucinations.        Mood and Affect: Mood normal. Mood is not anxious or depressed. Affect is not labile, blunt,  angry or inappropriate.        Speech: Speech normal.        Behavior: Behavior normal.        Thought Content: Thought content normal. Thought content is not paranoid or delusional. Thought content does not include homicidal or suicidal ideation. Thought content does not include homicidal or suicidal plan.        Cognition and Memory: Cognition and memory normal.        Judgment: Judgment normal.     Comments: Insight intact     Lab Review:  No results found for: NA, K, CL, CO2, GLUCOSE, BUN, CREATININE, CALCIUM, PROT, ALBUMIN, AST, ALT, ALKPHOS, BILITOT, GFRNONAA, GFRAA  No results found for: WBC, RBC, HGB, HCT, PLT, MCV, MCH, MCHC, RDW, LYMPHSABS, MONOABS, EOSABS, BASOSABS  No results found for: POCLITH, LITHIUM   No results found for: PHENYTOIN, PHENOBARB, VALPROATE, CBMZ   .res Assessment: Plan:    Plan:  PDMP reviewed  Vyvanse  60mg  to 50mg  daily  Lorazepam  2mg  at bedtime  Wellbutrin  XL 300mg  daily. Denies seizure history.  D/C Naltrexone  50mg  daily   RTC 6 months  25 minutes spent dedicated to the care of this patient on the date of this encounter to include pre-visit review of records, ordering of medication, post visit documentation, and face-to-face time with the patient discussing MDD, GAD, insomnia, and ADHD. Discussed continuing current medication regimen.  Discussed potential benefits, risk, and side effects of benzodiazepines to include potential risk of tolerance and dependence, as well as possible drowsiness.  Advised patient not to drive if experiencing drowsiness and to take lowest possible effective dose to minimize risk of dependence and tolerance.  Discussed potential benefits, risks, and side effects of stimulants with patient to include increased heart rate, palpitations, insomnia, increased anxiety, increased irritability, or decreased appetite.  Instructed patient to contact office if  experiencing any significant tolerability issues.  There are no diagnoses linked to this encounter.   Please see After Visit Summary for patient specific instructions.  Future Appointments  Date Time Provider Department Center  05/25/2024 10:00 AM Sherman Lipuma Nattalie, NP [facility] None    No orders of the defined types were placed in this encounter.     -------------------------------

## 2024-06-29 ENCOUNTER — Telehealth: Payer: Self-pay | Admitting: Sports Medicine
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
## 2024-06-30 NOTE — Telephone Encounter (Signed)
 Left message informing attorney

## 2024-07-07 ENCOUNTER — Other Ambulatory Visit: Payer: Self-pay | Admitting: Adult Health

## 2024-07-07 DIAGNOSIS — F411 Generalized anxiety disorder: Secondary | ICD-10-CM

## 2024-07-07 DIAGNOSIS — F331 Major depressive disorder, recurrent, moderate: Secondary | ICD-10-CM

## 2024-07-07 DIAGNOSIS — F901 Attention-deficit hyperactivity disorder, predominantly hyperactive type: Secondary | ICD-10-CM

## 2024-07-12 ENCOUNTER — Other Ambulatory Visit: Payer: Self-pay | Admitting: Adult Health

## 2024-07-12 DIAGNOSIS — F331 Major depressive disorder, recurrent, moderate: Secondary | ICD-10-CM

## 2024-07-13 ENCOUNTER — Other Ambulatory Visit: Payer: Self-pay | Admitting: Adult Health

## 2024-07-13 DIAGNOSIS — F331 Major depressive disorder, recurrent, moderate: Secondary | ICD-10-CM

## 2024-08-17 ENCOUNTER — Other Ambulatory Visit: Payer: Self-pay | Admitting: Adult Health

## 2024-08-17 DIAGNOSIS — F901 Attention-deficit hyperactivity disorder, predominantly hyperactive type: Secondary | ICD-10-CM

## 2024-08-17 NOTE — Telephone Encounter (Signed)
 Verify dose. Looks like was decreased from 60 mg to 50 mg.

## 2024-08-24 ENCOUNTER — Telehealth: Payer: Self-pay | Admitting: Adult Health

## 2024-08-24 ENCOUNTER — Other Ambulatory Visit: Payer: Self-pay

## 2024-08-24 DIAGNOSIS — F901 Attention-deficit hyperactivity disorder, predominantly hyperactive type: Secondary | ICD-10-CM

## 2024-08-24 MED ORDER — LISDEXAMFETAMINE DIMESYLATE 50 MG PO CAPS: 50.0000 mg | ORAL_CAPSULE | Freq: Every day | ORAL | 0 refills | Status: DC

## 2024-08-24 NOTE — Telephone Encounter (Signed)
 Pt called and said that she switched pharmacies due to insurance. So she needs all the vyvanses cancelled at [facility]. [facility] So please [facility] a script of 50 mg of [facility]se  [facility]

## 2024-08-24 NOTE — Telephone Encounter (Addendum)
 Canceled at [facility]. Repended to [facility].
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
## 2024-09-02 ENCOUNTER — Ambulatory Visit (INDEPENDENT_AMBULATORY_CARE_PROVIDER_SITE_OTHER): Admitting: Sports Medicine

## 2024-09-02 VITALS — Ht 65.0 in | Wt 133.0 lb

## 2024-09-02 DIAGNOSIS — S060X0S Concussion without loss of consciousness, sequela: Secondary | ICD-10-CM

## 2024-09-02 DIAGNOSIS — R4701 Aphasia: Secondary | ICD-10-CM | POA: Diagnosis not present

## 2024-09-02 NOTE — Patient Instructions (Addendum)
 Brain MRI   Speech therapy address [address] [place],  [place]  [address]  Follow up 1 week after MRI to discuss results

## 2024-09-22 ENCOUNTER — Other Ambulatory Visit: Payer: Self-pay | Admitting: Adult Health

## 2024-09-22 DIAGNOSIS — F331 Major depressive disorder, recurrent, moderate: Secondary | ICD-10-CM

## 2024-09-22 DIAGNOSIS — F411 Generalized anxiety disorder: Secondary | ICD-10-CM

## 2024-09-23 ENCOUNTER — Ambulatory Visit
Admission: RE | Admit: 2024-09-23 | Discharge: 2024-09-23 | Disposition: A | Discharge status: Home / Self Care | Source: Ambulatory Visit | Attending: Sports Medicine | Admitting: Sports Medicine

## 2024-09-23 DIAGNOSIS — R4701 Aphasia: Secondary | ICD-10-CM

## 2024-09-23 DIAGNOSIS — S060X0S Concussion without loss of consciousness, sequela: Secondary | ICD-10-CM

## 2024-09-23 MED ORDER — GADOPICLENOL 0.5 MMOL/ML IV SOLN
6.0000 mL | Freq: Once | INTRAVENOUS | Status: AC | PRN
  Administered 2024-09-23: 6 mL via INTRAVENOUS

## 2024-09-26 ENCOUNTER — Ambulatory Visit: Payer: Self-pay | Admitting: Sports Medicine

## 2024-10-07 ENCOUNTER — Ambulatory Visit: Admitting: Sports Medicine

## 2024-10-22 ENCOUNTER — Other Ambulatory Visit: Payer: Self-pay | Admitting: Adult Health

## 2024-10-22 DIAGNOSIS — F411 Generalized anxiety disorder: Secondary | ICD-10-CM

## 2024-10-22 DIAGNOSIS — F331 Major depressive disorder, recurrent, moderate: Secondary | ICD-10-CM
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
## 2024-10-28 ENCOUNTER — Ambulatory Visit: Admitting: Sports Medicine
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
## 2024-11-07 ENCOUNTER — Ambulatory Visit (INDEPENDENT_AMBULATORY_CARE_PROVIDER_SITE_OTHER): Admitting: Sports Medicine

## 2024-11-07 VITALS — BP 120/80 | HR 78 | Ht 65.0 in | Wt 130.0 lb

## 2024-11-07 DIAGNOSIS — R4701 Aphasia: Secondary | ICD-10-CM | POA: Diagnosis not present

## 2024-11-07 DIAGNOSIS — S060X0S Concussion without loss of consciousness, sequela: Secondary | ICD-10-CM | POA: Diagnosis not present

## 2024-11-07 NOTE — Patient Instructions (Signed)
 Speech therapy referral   As needed follow up

## 2024-11-23 ENCOUNTER — Other Ambulatory Visit: Payer: Self-pay | Admitting: Adult Health

## 2024-11-23 DIAGNOSIS — F331 Major depressive disorder, recurrent, moderate: Secondary | ICD-10-CM

## 2024-11-23 DIAGNOSIS — F411 Generalized anxiety disorder: Secondary | ICD-10-CM

## 2024-12-22 ENCOUNTER — Other Ambulatory Visit: Payer: Self-pay | Admitting: Adult Health

## 2024-12-22 DIAGNOSIS — F901 Attention-deficit hyperactivity disorder, predominantly hyperactive type: Secondary | ICD-10-CM

## 2024-12-22 DIAGNOSIS — F411 Generalized anxiety disorder: Secondary | ICD-10-CM

## 2024-12-22 DIAGNOSIS — F331 Major depressive disorder, recurrent, moderate: Secondary | ICD-10-CM

## 2024-12-23 NOTE — Telephone Encounter (Signed)
"  Due for 6 month follow up  "

## 2024-12-23 NOTE — Telephone Encounter (Signed)
 Please send last refill lorazepam  12/10 Last refill vyvanse  11/8  Due for her 6 month follow up

## 2024-12-27 NOTE — Telephone Encounter (Signed)
 LVM to schedule f/u

## 2024-12-27 NOTE — Telephone Encounter (Signed)
Scheduled 12/6

## 2025-01-09 ENCOUNTER — Telehealth: Admitting: Adult Health

## 2025-01-09 ENCOUNTER — Encounter: Payer: Self-pay | Admitting: Adult Health

## 2025-01-09 DIAGNOSIS — F329 Major depressive disorder, single episode, unspecified: Secondary | ICD-10-CM | POA: Diagnosis not present

## 2025-01-09 DIAGNOSIS — F901 Attention-deficit hyperactivity disorder, predominantly hyperactive type: Secondary | ICD-10-CM

## 2025-01-09 DIAGNOSIS — F411 Generalized anxiety disorder: Secondary | ICD-10-CM

## 2025-01-09 DIAGNOSIS — F909 Attention-deficit hyperactivity disorder, unspecified type: Secondary | ICD-10-CM | POA: Diagnosis not present

## 2025-01-09 DIAGNOSIS — F331 Major depressive disorder, recurrent, moderate: Secondary | ICD-10-CM

## 2025-01-09 DIAGNOSIS — G4700 Insomnia, unspecified: Secondary | ICD-10-CM

## 2025-01-09 MED ORDER — LISDEXAMFETAMINE DIMESYLATE 50 MG PO CAPS: 50.0000 mg | ORAL_CAPSULE | Freq: Every day | ORAL | 0 refills | Status: AC

## 2025-01-09 MED ORDER — LORAZEPAM 2 MG PO TABS: 2.0000 mg | ORAL_TABLET | Freq: Every evening | ORAL | 2 refills | Status: AC | PRN

## 2025-01-09 MED ORDER — BUPROPION HCL ER (XL) 300 MG PO TB24: 300.0000 mg | ORAL_TABLET | Freq: Every day | ORAL | 1 refills | Status: AC

## 2025-01-09 NOTE — Progress Notes (Signed)
 Maureen White [id number] December 08, 1966 59 y.o.  Virtual Visit via Video Note  I connected with pt @ on 01/09/25 at 10:00 AM EST by a video enabled telemedicine application and verified that I am speaking with the correct person using two identifiers.   I discussed the limitations of evaluation and management by telemedicine and the availability of in person appointments. The patient expressed understanding and agreed to proceed.  I discussed the assessment and treatment plan with the patient. The patient was provided an opportunity to ask questions and all were answered. The patient agreed with the plan and demonstrated an understanding of the instructions.   The patient was advised to call back or seek an in-person evaluation if the symptoms worsen or if the condition fails to improve as anticipated.  I provided 15 minutes of non-face-to-face time during this encounter.  The patient was located at home.  The provider was located at [facility].   Angeline LOISE Sayers, NP   Subjective:   Patient ID:  Maureen White is a 59 y.o. (DOB 09-Jul-1966) female.  Chief Complaint: No chief complaint on file.   HPI Maureen White presents for follow-up of MDD, GAD, insomnia, and ADHD.  Describes mood today as ok. Pleasant. Tearful at times. Mood symptoms - denies depression. Reports some anxiety and irritability. Reports improved interest and motivation. Reporting some situational stressors - work. Denies recent panic attacks. Denies some worry - situational. Denies rumination and over thinking. Reports mood is stable. Stating I feel like I'm doing alright. Feels like medications are helpful. Taking medications as prescribed. Energy levels stable. Active, has a regular exercise routine.   Enjoys some usual interests and activities. Dating. Lives alone with dog. Brothers in [place] and [place] . Spending time with family. Appetite adequate. Weight loss (intentional) - 122  pounds. Sleeping well most nights. Averages 8 hours. Focus and concentration improved with Vyvanse . Completing tasks. Managing aspects of household. 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  Denies AH or VH. Denies self harm. Denies substance use.  Denies alcohol use.  Previous medication trials: Wellbutrin , Adderall, Prozac, Zoloft,   Review of Systems:  Review of Systems  Musculoskeletal:  Negative for gait problem.  Neurological:  Negative for tremors.  Psychiatric/Behavioral:         Please refer to HPI    Medications: I have reviewed the patient's current medications.  Current Outpatient Medications  Medication Sig Dispense Refill   amantadine  (SYMMETREL ) 100 MG capsule Take 1 capsule (100 mg total) by mouth 2 (two) times daily. 60 capsule 0   buPROPion  (WELLBUTRIN  XL) 300 MG 24 hr tablet TAKE 1 TABLET BY MOUTH EVERY DAY 90 tablet 0   ibuprofen  (ADVIL ) 800 MG tablet Take 1 tablet (800 mg total) by mouth every 6 (six) hours as needed for mild pain. 21 tablet 0   lisdexamfetamine  (VYVANSE ) 50 MG capsule Take 1 capsule (50 mg total) by mouth daily. 30 capsule 0   lisdexamfetamine  (VYVANSE ) 50 MG capsule Take 1 capsule (50 mg total) by mouth daily. 30 capsule 0   lisdexamfetamine  (VYVANSE ) 50 MG capsule TAKE 1 CAPSULE BY MOUTH DAILY 30 capsule 0   LORazepam  (ATIVAN ) 2 MG tablet TAKE 1 TABLET BY MOUTH AT BEDTIME AS NEEDED FOR ANXIETY 30 tablet 0   naltrexone  (DEPADE) 50 MG tablet Take 1 tablet (50 mg total) by mouth daily. 30 tablet 2   neomycin-polymyxin b-dexamethasone (MAXITROL) 3.5-10000-0.1 SUSP SMARTSIG:In Eye(s)     triamcinolone (KENALOG) 0.1 %  SMARTSIG:1 Application Topical 2-3 Times Daily     valACYclovir  (VALTREX ) 1000 MG tablet Take 2 pills twice a day for one day at the onset of a blister 12 tablet 1   No current facility-administered medications for this visit.    Medication Side Effects: None  Allergies: Allergies[1]  No past medical history on file.  Family  History  Problem Relation Age of Onset   Breast cancer Maternal Aunt        late 91's    Social History   Socioeconomic History   Marital status: Single    Spouse name: Not on file   Number of children: Not on file   Years of education: Not on file   Highest education level: Not on file  Occupational History   Not on file  Tobacco Use   Smoking status: Never   Smokeless tobacco: Never  Vaping Use   Vaping status: Never Used  Substance and Sexual Activity   Alcohol use: Yes    Alcohol/week: 0.0 standard drinks of alcohol    Comment: weekends usually   Drug use: No   Sexual activity: Not on file  Other Topics Concern   Not on file  Social History Narrative   Not on file   Social Drivers of Health   Tobacco Use: Low Risk (05/25/2024)   Patient History    Smoking Tobacco Use: Never    Smokeless Tobacco Use: Never    Passive Exposure: Not on file  Financial Resource Strain: Not on file  Food Insecurity: Not on file  Transportation Needs: Not on file  Physical Activity: Not on file  Stress: Not on file  Social Connections: Not on file  Intimate Partner Violence: Not on file  Depression (EYV7-0): Not on file  Alcohol Screen: Not on file  Housing: Not on file  Utilities: Not on file  Health Literacy: Not on file    Past Medical History, Surgical history, Social history, and Family history were reviewed and updated as appropriate.   Please see review of systems for further details on the patient's review from today.   Objective:   Physical Exam:  There were no vitals taken for this visit.  Physical Exam Constitutional:      General: She is not in acute distress. Musculoskeletal:        General: No deformity.  Neurological:     Mental Status: She is alert and oriented to person, place, and time.     Coordination: Coordination normal.  Psychiatric:        Attention and Perception: Attention and perception normal. She does not perceive auditory or visual  hallucinations.        Mood and Affect: Mood normal. Mood is not anxious or depressed. Affect is not labile, blunt, angry or inappropriate.        Speech: Speech normal.        Behavior: Behavior normal.        Thought Content: Thought content normal. Thought content is not paranoid or delusional. Thought content does not include homicidal or suicidal ideation. Thought content does not include homicidal or suicidal plan.        Cognition and Memory: Cognition and memory normal.        Judgment: Judgment normal.     Comments: Insight intact     Lab Review:  No results found for: NA, K, CL, CO2, GLUCOSE, BUN, CREATININE, CALCIUM, PROT, ALBUMIN, AST, ALT, ALKPHOS, BILITOT, GFRNONAA, GFRAA  No results found for: WBC,  RBC, HGB, HCT, PLT, MCV, MCH, MCHC, RDW, LYMPHSABS, MONOABS, EOSABS, BASOSABS  No results found for: POCLITH, LITHIUM   No results found for: PHENYTOIN, PHENOBARB, VALPROATE, CBMZ   .res Assessment: Plan:    Plan:  PDMP reviewed  Vyvanse  50mg  daily  Lorazepam  2mg  at bedtime  Wellbutrin  XL 300mg  daily. Denies seizure history.  RTC 6 months  15 minutes spent dedicated to the care of this patient on the date of this encounter to include pre-visit review of records, ordering of medication, post visit documentation, and face-to-face time with the patient discussing MDD, GAD, insomnia, and ADHD. Discussed continuing current medication regimen.  Discussed potential benefits, risk, and side effects of benzodiazepines to include potential risk of tolerance and dependence, as well as possible drowsiness.  Advised patient not to drive if experiencing drowsiness and to take lowest possible effective dose to minimize risk of dependence and tolerance.  Discussed potential benefits, risks, and side effects of stimulants with patient to include increased heart rate, palpitations, insomnia, increased anxiety, increased  irritability, or decreased appetite.  Instructed patient to contact office if experiencing any significant tolerability issues.  There are no diagnoses linked to this encounter.   Please see After Visit Summary for patient specific instructions.  Future Appointments  Date Time Provider Department Center  01/09/2025 10:00 AM Matthew Cina Nattalie, NP [facility] None    No orders of the defined types were placed in this encounter.     -------------------------------      [1] No Known Allergies
